# Patient Record
Sex: Female | Born: 1985 | Race: Black or African American | Hispanic: No | Marital: Single | Smoking: Current every day smoker
Health system: Southern US, Community
[De-identification: ages and names within clinical notes are randomized; demographics above are authoritative.]

## PROBLEM LIST (undated history)

## (undated) DIAGNOSIS — J40 Bronchitis, not specified as acute or chronic: Secondary | ICD-10-CM

## (undated) DIAGNOSIS — L732 Hidradenitis suppurativa: Secondary | ICD-10-CM

## (undated) DIAGNOSIS — Z8719 Personal history of other diseases of the digestive system: Secondary | ICD-10-CM

## (undated) HISTORY — PX: SKIN BIOPSY: SHX1

## (undated) HISTORY — PX: CYSTECTOMY: SUR359

---

## 2011-07-18 ENCOUNTER — Emergency Department (INDEPENDENT_AMBULATORY_CARE_PROVIDER_SITE_OTHER): Payer: Self-pay

## 2011-07-18 ENCOUNTER — Emergency Department (HOSPITAL_BASED_OUTPATIENT_CLINIC_OR_DEPARTMENT_OTHER)
Admission: EM | Admit: 2011-07-18 | Discharge: 2011-07-18 | Disposition: A | Discharge status: Home / Self Care | Payer: Self-pay | Attending: Emergency Medicine | Admitting: Emergency Medicine

## 2011-07-18 ENCOUNTER — Encounter (HOSPITAL_BASED_OUTPATIENT_CLINIC_OR_DEPARTMENT_OTHER): Payer: Self-pay | Admitting: Family Medicine

## 2011-07-18 DIAGNOSIS — R0602 Shortness of breath: Secondary | ICD-10-CM

## 2011-07-18 DIAGNOSIS — R51 Headache: Secondary | ICD-10-CM | POA: Insufficient documentation

## 2011-07-18 DIAGNOSIS — J069 Acute upper respiratory infection, unspecified: Secondary | ICD-10-CM | POA: Insufficient documentation

## 2011-07-18 DIAGNOSIS — R509 Fever, unspecified: Secondary | ICD-10-CM | POA: Insufficient documentation

## 2011-07-18 DIAGNOSIS — R0989 Other specified symptoms and signs involving the circulatory and respiratory systems: Secondary | ICD-10-CM

## 2011-07-18 DIAGNOSIS — R05 Cough: Secondary | ICD-10-CM

## 2011-07-18 MED ORDER — OXYMETAZOLINE HCL 0.05 % NA SOLN
2.0000 | Freq: Once | NASAL | Status: AC
  Administered 2011-07-18: 2 via NASAL
  Filled 2011-07-18: qty 15

## 2011-07-18 NOTE — ED Notes (Signed)
Pt c/o cough of clear sputum, sneezing and headache x 1 wk but worse since yesterday. Pt denies fever, n/v.

## 2011-07-18 NOTE — ED Notes (Signed)
Pt in xray

## 2011-07-18 NOTE — ED Provider Notes (Signed)
History  This chart was scribed for Cyndra Numbers, MD by Bennett Scrape. This patient was seen in room MH03/MH03 and the patient's care was started at 3:38PM.  CSN: [id number]  Arrival date & time 07/18/11  1401   First MD Initiated Contact with Patient 07/18/11 1537      Chief Complaint  Patient presents with  . Cough    Patient is a 26 y.o. female presenting with cough. The history is provided by the patient. No language interpreter was used.  Cough This is a new problem. The current episode started more than 2 days ago. The problem occurs every few minutes. The problem has been gradually worsening. The cough is productive of sputum. The maximum temperature recorded prior to her arrival was 100 to 100.9 F. Associated symptoms include headaches. Pertinent negatives include no chest pain, no chills, no sweats, no ear congestion, no ear pain, no rhinorrhea, no sore throat, no myalgias, no shortness of breath, no wheezing and no eye redness. Her past medical history does not include asthma.    Bridget Griffin is a 26 y.o. female who presents to the Emergency Department complaining of one week of gradual onset, gradually worsening productive cough of clear sputum with associated nasal congestion, chest pain with coughing, sneezing, mild fever and HA. Fever was measured at 99 in the ED. Pt states that the sympotms have been worse since yesterday. She denies any modifying factors. She has been tasking tylenol at home with mild improvement in the symptoms. She denies having any previous episodes of similar symptoms. She confirms sick contacts at home with cold like symptoms and pneumonia. She has not received a flu shot this year. She denies any urinary problems, nausea, vomiting and diarrhea as associated sympotms. She has no h/o chronic medical conditions and is not on any regular medications at home. She is a current smoker and alcohol user.  History reviewed. No pertinent past medical  history.  Past Surgical History  Procedure Date  . Skin biopsy     History reviewed. No pertinent family history.  History  Substance Use Topics  . Smoking status: Current Everyday Smoker  . Smokeless tobacco: Not on file  . Alcohol Use: Yes    Review of Systems  Constitutional: Positive for fever. Negative for chills.  HENT: Negative for ear pain, sore throat and rhinorrhea.   Eyes: Negative for redness.  Respiratory: Positive for cough. Negative for shortness of breath and wheezing.   Cardiovascular: Negative for chest pain.  Gastrointestinal: Negative for nausea, vomiting, abdominal pain and diarrhea.  Genitourinary: Negative for dysuria, hematuria and vaginal discharge.  Musculoskeletal: Negative for myalgias.  Skin: Negative for rash.  Neurological: Positive for headaches. Negative for weakness.    Allergies  Review of patient's allergies indicates no known allergies.  Home Medications  No current outpatient prescriptions on file.  Triage Vitals: BP 118/74  Pulse 84  Temp(Src) 99 F (37.2 C) (Oral)  Resp 16  Ht 5\' 4"  (1.626 m)  Wt 214 lb 2 oz (97.126 kg)  BMI 36.75 kg/m2  SpO2 100%  LMP 07/17/2011  Physical Exam  Nursing note and vitals reviewed. Constitutional: She is oriented to person, place, and time. She appears well-developed and well-nourished.  HENT:  Head: Normocephalic and atraumatic.       Nasal congestion  Eyes: Conjunctivae and EOM are normal.  Neck: Normal range of motion. Neck supple.  Cardiovascular: Normal rate, regular rhythm and normal heart sounds.   Pulmonary/Chest: Effort normal. No respiratory  distress.       Diminished at the right base  Abdominal: Soft. There is no tenderness.  Musculoskeletal: Normal range of motion. She exhibits no edema.  Neurological: She is alert and oriented to person, place, and time. No cranial nerve deficit.  Skin: Skin is warm and dry. No rash noted.  Psychiatric: She has a normal mood and affect.  Her behavior is normal.    ED Course  Procedures (including critical care time)  DIAGNOSTIC STUDIES: Oxygen Saturation is 100% on room air, normal by my interpretation.    COORDINATION OF CARE: 3:40PM-Discussed chest x-ray and nasal spray with pt at bedside and pt agreed to plan. 4:19PM-Discussed negative chest x-ray with pt and pt acknowledged results. Pt is comfortable being discharged home.  Labs Reviewed - No data to display  Dg Chest 2 View  07/18/2011  *RADIOLOGY REPORT*  Clinical Data: Shortness of breath, cough, congestion.  CHEST - 2 VIEW  Comparison: None.  Findings: Heart and mediastinal contours are within normal limits. No focal opacities or effusions.  No acute bony abnormality.  IMPRESSION: No active cardiopulmonary disease.  Original Report Authenticated By: Cyndie Chime, M.D.     1. URI (upper respiratory infection)       MDM  Patient had clear presentation with URI likely viral.  Given lung exam CXR was performed. This was negative and patient was discharged with afrin.  I personally performed the services described in this documentation, which was scribed in my presence. The recorded information has been reviewed and considered.         Cyndra Numbers, MD 07/20/11 [phone]

## 2011-10-06 ENCOUNTER — Encounter (HOSPITAL_BASED_OUTPATIENT_CLINIC_OR_DEPARTMENT_OTHER): Payer: Self-pay

## 2011-10-06 ENCOUNTER — Emergency Department (HOSPITAL_BASED_OUTPATIENT_CLINIC_OR_DEPARTMENT_OTHER)
Admission: EM | Admit: 2011-10-06 | Discharge: 2011-10-06 | Disposition: A | Discharge status: Home / Self Care | Payer: Self-pay | Attending: Emergency Medicine | Admitting: Emergency Medicine

## 2011-10-06 DIAGNOSIS — R1110 Vomiting, unspecified: Secondary | ICD-10-CM | POA: Insufficient documentation

## 2011-10-06 DIAGNOSIS — R197 Diarrhea, unspecified: Secondary | ICD-10-CM | POA: Insufficient documentation

## 2011-10-06 LAB — URINALYSIS, ROUTINE W REFLEX MICROSCOPIC
Glucose, UA: NEGATIVE mg/dL
Hgb urine dipstick: NEGATIVE
Specific Gravity, Urine: 1.026 (ref 1.005–1.030)
pH: 6 (ref 5.0–8.0)

## 2011-10-06 LAB — PREGNANCY, URINE: Preg Test, Ur: NEGATIVE

## 2011-10-06 MED ORDER — ONDANSETRON 4 MG PO TBDP
4.0000 mg | ORAL_TABLET | Freq: Once | ORAL | Status: AC
  Administered 2011-10-06: 4 mg via ORAL
  Filled 2011-10-06: qty 1

## 2011-10-06 MED ORDER — ONDANSETRON 4 MG PO TBDP: 4.0000 mg | ORAL_TABLET | Freq: Three times a day (TID) | ORAL | Status: AC | PRN

## 2011-10-06 NOTE — ED Provider Notes (Signed)
History     CSN: [id number]  Arrival date & time 10/06/11  1102   First MD Initiated Contact with Patient 10/06/11 1158      Chief Complaint  Patient presents with  . Emesis  . Diarrhea    (Consider location/radiation/quality/duration/timing/severity/associated sxs/prior treatment) Patient is a 26 y.o. female presenting with vomiting and diarrhea. The history is provided by the patient. No language interpreter was used.  Emesis  This is a new problem. The current episode started yesterday. The problem occurs 5 to 10 times per day. The problem has not changed since onset.There has been no fever. Associated symptoms include diarrhea. Pertinent negatives include no fever.  Diarrhea The primary symptoms include vomiting and diarrhea. Primary symptoms do not include fever.    History reviewed. No pertinent past medical history.  Past Surgical History  Procedure Date  . Skin biopsy   . Cystectomy     No family history on file.  History  Substance Use Topics  . Smoking status: Current Everyday Smoker  . Smokeless tobacco: Not on file  . Alcohol Use: No    OB History    Grav Para Term Preterm Abortions TAB SAB Ect Mult Living                  Review of Systems  Constitutional: Negative for fever.  Eyes: Negative.   Respiratory: Negative.   Cardiovascular: Negative.   Gastrointestinal: Positive for vomiting and diarrhea.    Allergies  Review of patient's allergies indicates no known allergies.  Home Medications  No current outpatient prescriptions on file.  BP 125/91  Pulse 82  Temp(Src) 97.8 F (36.6 C) (Oral)  Resp 16  Ht 5\' 4"  (1.626 m)  Wt 210 lb (95.255 kg)  BMI 36.05 kg/m2  SpO2 100%  LMP 09/28/2011  Physical Exam  Nursing note and vitals reviewed. Constitutional: She is oriented to person, place, and time. She appears well-developed and well-nourished.  HENT:  Head: Normocephalic and atraumatic.  Eyes: Conjunctivae and EOM are normal.  Neck:  Neck supple.  Cardiovascular: Normal rate and regular rhythm.   Pulmonary/Chest: Effort normal and breath sounds normal.  Abdominal: Soft. Bowel sounds are normal. There is no tenderness.  Musculoskeletal: Normal range of motion.  Neurological: She is alert and oriented to person, place, and time.  Skin: Skin is warm and dry.  Psychiatric: She has a normal mood and affect.    ED Course  Procedures (including critical care time)   Labs Reviewed  URINALYSIS, ROUTINE W REFLEX MICROSCOPIC  PREGNANCY, URINE   No results found.   1. Vomiting and diarrhea       MDM  Pt showing not sign of dehydration:pt tolerating po here:symptoms likely viral:will send home with some zofran        Teressa Lower, NP 10/06/11 1409

## 2011-10-06 NOTE — Discharge Instructions (Signed)
B.R.A.T. Diet Your doctor has recommended the B.R.A.T. diet for you or your child until the condition improves. This is often used to help control diarrhea and vomiting symptoms. If you or your child can tolerate clear liquids, you may have:  Bananas.   Rice.   Applesauce.   Toast (and other simple starches such as crackers, potatoes, noodles).  Be sure to avoid dairy products, meats, and fatty foods until symptoms are better. Fruit juices such as apple, grape, and prune juice can make diarrhea worse. Avoid these. Continue this diet for 2 days or as instructed by your caregiver. Document Released: 06/20/2005 Document Revised: 06/09/2011 Document Reviewed: 12/07/2006 Exit[employer] Patient Information 2012 [employer]. 

## 2011-10-06 NOTE — ED Notes (Signed)
C/o n/v/d started yesterday-NAD-steady gait to tx area

## 2011-10-06 NOTE — ED Notes (Signed)
Pt given ginger ale per request

## 2011-10-06 NOTE — ED Provider Notes (Signed)
Medical screening examination/treatment/procedure(s) were performed by non-physician practitioner and as supervising physician I was immediately available for consultation/collaboration.   Dione Booze, MD 10/06/11 1626

## 2012-01-20 ENCOUNTER — Encounter (HOSPITAL_BASED_OUTPATIENT_CLINIC_OR_DEPARTMENT_OTHER): Payer: Self-pay | Admitting: *Deleted

## 2012-01-20 ENCOUNTER — Emergency Department (HOSPITAL_BASED_OUTPATIENT_CLINIC_OR_DEPARTMENT_OTHER)
Admission: EM | Admit: 2012-01-20 | Discharge: 2012-01-20 | Disposition: A | Discharge status: Home / Self Care | Payer: Self-pay | Attending: Emergency Medicine | Admitting: Emergency Medicine

## 2012-01-20 DIAGNOSIS — F17200 Nicotine dependence, unspecified, uncomplicated: Secondary | ICD-10-CM | POA: Insufficient documentation

## 2012-01-20 DIAGNOSIS — R1110 Vomiting, unspecified: Secondary | ICD-10-CM | POA: Insufficient documentation

## 2012-01-20 DIAGNOSIS — R197 Diarrhea, unspecified: Secondary | ICD-10-CM | POA: Insufficient documentation

## 2012-01-20 DIAGNOSIS — N390 Urinary tract infection, site not specified: Secondary | ICD-10-CM | POA: Insufficient documentation

## 2012-01-20 LAB — URINALYSIS, ROUTINE W REFLEX MICROSCOPIC
Nitrite: NEGATIVE
Specific Gravity, Urine: 1.02 (ref 1.005–1.030)
pH: 6 (ref 5.0–8.0)

## 2012-01-20 LAB — URINE MICROSCOPIC-ADD ON

## 2012-01-20 MED ORDER — ONDANSETRON 4 MG PO TBDP
4.0000 mg | ORAL_TABLET | Freq: Once | ORAL | Status: AC
  Administered 2012-01-20: 4 mg via ORAL
  Filled 2012-01-20: qty 1

## 2012-01-20 MED ORDER — ONDANSETRON 4 MG PO TBDP: 4.0000 mg | ORAL_TABLET | Freq: Three times a day (TID) | ORAL | Status: AC | PRN

## 2012-01-20 MED ORDER — SULFAMETHOXAZOLE-TRIMETHOPRIM 800-160 MG PO TABS: 1.0000 | ORAL_TABLET | Freq: Two times a day (BID) | ORAL | Status: AC

## 2012-01-20 NOTE — ED Notes (Signed)
Diarrhea and vomiting since yesterday.

## 2012-01-20 NOTE — ED Provider Notes (Signed)
History/physical exam/procedure(s) were performed by non-physician practitioner and as supervising physician I was immediately available for consultation/collaboration. I have reviewed all notes and am in agreement with care and plan.   Sianne Tejada S Lenyx Boody, MD 01/20/12 2314 

## 2012-01-20 NOTE — ED Notes (Signed)
pt reports N/V and diahrrea since yesterday, states she has also been having lots of indigestion. Also states that she has been around someone with the, "stomach bug."

## 2012-01-20 NOTE — ED Provider Notes (Signed)
History     CSN: [id number]  Arrival date & time 01/20/12  6578   First MD Initiated Contact with Patient 01/20/12 1953      Chief Complaint  Patient presents with  . GI Problem    (Consider location/radiation/quality/duration/timing/severity/associated sxs/prior treatment) HPI Comments: Pt states that 2 days ago he had vomiting which has resolved today but she has continued to have diarrhea:pt denies fever:pt states that her family member had a stomach virus and she thinks she has the same  Patient is a 26 y.o. female presenting with GI illness. The history is provided by the patient. No language interpreter was used.  GI Problem  This is a new problem. The current episode started 12 to 24 hours ago. There has been no fever. She has tried nothing for the symptoms.    History reviewed. No pertinent past medical history.  Past Surgical History  Procedure Date  . Skin biopsy   . Cystectomy     No family history on file.  History  Substance Use Topics  . Smoking status: Current Everyday Smoker  . Smokeless tobacco: Not on file  . Alcohol Use: No    OB History    Grav Para Term Preterm Abortions TAB SAB Ect Mult Living                  Review of Systems  Constitutional: Negative.   Respiratory: Negative.   Cardiovascular: Negative.   Neurological: Negative.     Allergies  Review of patient's allergies indicates no known allergies.  Home Medications  No current outpatient prescriptions on file.  BP 126/76  Pulse 77  Temp 98.3 F (36.8 C) (Oral)  Resp 20  SpO2 100%  Physical Exam  Nursing note and vitals reviewed. Constitutional: She is oriented to person, place, and time. She appears well-developed and well-nourished.  Cardiovascular: Normal rate and regular rhythm.   Pulmonary/Chest: Effort normal and breath sounds normal.  Abdominal: Soft. Bowel sounds are normal. There is no tenderness.  Neurological: She is alert and oriented to person, place, and  time.  Skin: Skin is warm and dry.    ED Course  Procedures (including critical care time)  Labs Reviewed  URINALYSIS, ROUTINE W REFLEX MICROSCOPIC - Abnormal; Notable for the following:    APPearance CLOUDY (*)     Leukocytes, UA MODERATE (*)     All other components within normal limits  URINE MICROSCOPIC-ADD ON - Abnormal; Notable for the following:    Squamous Epithelial / LPF FEW (*)     Bacteria, UA FEW (*)     All other components within normal limits  PREGNANCY, URINE   No results found.   1. UTI (lower urinary tract infection)   2. Vomiting and diarrhea       MDM  Pts abdomen is benign:pt tolerating po here:symptoms likely viral:pt given something for nausea:don't think pt needs fluids at this time        Teressa Lower, NP 01/20/12 2112

## 2012-09-25 ENCOUNTER — Encounter (HOSPITAL_BASED_OUTPATIENT_CLINIC_OR_DEPARTMENT_OTHER): Payer: Self-pay | Admitting: *Deleted

## 2012-09-25 ENCOUNTER — Emergency Department (HOSPITAL_BASED_OUTPATIENT_CLINIC_OR_DEPARTMENT_OTHER)
Admission: EM | Admit: 2012-09-25 | Discharge: 2012-09-25 | Disposition: A | Discharge status: Home / Self Care | Payer: Self-pay | Attending: Emergency Medicine | Admitting: Emergency Medicine

## 2012-09-25 ENCOUNTER — Emergency Department (HOSPITAL_BASED_OUTPATIENT_CLINIC_OR_DEPARTMENT_OTHER): Payer: Self-pay

## 2012-09-25 DIAGNOSIS — J209 Acute bronchitis, unspecified: Secondary | ICD-10-CM | POA: Insufficient documentation

## 2012-09-25 DIAGNOSIS — J40 Bronchitis, not specified as acute or chronic: Secondary | ICD-10-CM

## 2012-09-25 DIAGNOSIS — F17200 Nicotine dependence, unspecified, uncomplicated: Secondary | ICD-10-CM | POA: Insufficient documentation

## 2012-09-25 DIAGNOSIS — J3489 Other specified disorders of nose and nasal sinuses: Secondary | ICD-10-CM | POA: Insufficient documentation

## 2012-09-25 MED ORDER — ALBUTEROL SULFATE HFA 108 (90 BASE) MCG/ACT IN AERS: 2.0000 | INHALATION_SPRAY | Freq: Once | RESPIRATORY_TRACT | Status: DC

## 2012-09-25 MED ORDER — ALBUTEROL SULFATE (5 MG/ML) 0.5% IN NEBU
2.5000 mg | INHALATION_SOLUTION | RESPIRATORY_TRACT | Status: DC
  Administered 2012-09-25: 2.5 mg via RESPIRATORY_TRACT

## 2012-09-25 MED ORDER — ALBUTEROL SULFATE HFA 108 (90 BASE) MCG/ACT IN AERS
INHALATION_SPRAY | RESPIRATORY_TRACT | Status: AC
  Filled 2012-09-25: qty 6.7

## 2012-09-25 MED ORDER — BENZONATATE 100 MG PO CAPS: 100.0000 mg | ORAL_CAPSULE | Freq: Three times a day (TID) | ORAL | Status: DC

## 2012-09-25 MED ORDER — ALBUTEROL SULFATE (5 MG/ML) 0.5% IN NEBU
INHALATION_SOLUTION | RESPIRATORY_TRACT | Status: AC
  Administered 2012-09-25: 2.5 mg via RESPIRATORY_TRACT
  Filled 2012-09-25: qty 0.5

## 2012-09-25 MED ORDER — AZITHROMYCIN 250 MG PO TABS: 250.0000 mg | ORAL_TABLET | Freq: Every day | ORAL | Status: DC

## 2012-09-25 MED ORDER — ALBUTEROL SULFATE HFA 108 (90 BASE) MCG/ACT IN AERS: 2.0000 | INHALATION_SPRAY | RESPIRATORY_TRACT | Status: DC | PRN

## 2012-09-25 NOTE — ED Notes (Signed)
RT Note: Patient was instructed on proper MDI use with spacer. She is familiar with using a spacer and MDI and demonstrated technique well. Patient currently has no questions and RT will continue to monitor.

## 2012-09-25 NOTE — ED Notes (Signed)
Pt amb to triage with quick steady gait in nad. Pt reports cough and congestion x 2 weeks, seen at [facility] and rx amoxicillin for bronchitis, pt states she is no better. Completed amoxicillin course.

## 2012-09-25 NOTE — ED Notes (Signed)
Patient transported to X-ray 

## 2012-09-25 NOTE — ED Provider Notes (Signed)
History     CSN: [id number]  Arrival date & time 09/25/12  1522   First MD Initiated Contact with Patient 09/25/12 1533      Chief Complaint  Patient presents with  . Cough  . Nasal Congestion    (Consider location/radiation/quality/duration/timing/severity/associated sxs/prior treatment) Patient is a 27 y.o. female presenting with cough. The history is provided by the patient. No language interpreter was used.  Cough Cough characteristics:  Productive and harsh Sputum characteristics:  Clear Severity:  Severe Onset quality:  Gradual Timing:  Constant Progression:  Worsening Chronicity:  New Smoker: yes   Relieved by:  Nothing Worsened by:  Nothing tried Associated symptoms: no shortness of breath     History reviewed. No pertinent past medical history.  Past Surgical History  Procedure Laterality Date  . Skin biopsy    . Cystectomy      History reviewed. No pertinent family history.  History  Substance Use Topics  . Smoking status: Current Every Day Smoker  . Smokeless tobacco: Not on file  . Alcohol Use: No    OB History   Grav Para Term Preterm Abortions TAB SAB Ect Mult Living                  Review of Systems  Respiratory: Positive for cough. Negative for shortness of breath.   All other systems reviewed and are negative.    Allergies  Review of patient's allergies indicates no known allergies.  Home Medications   Current Outpatient Rx  Name  Route  Sig  Dispense  Refill  . ibuprofen (ADVIL,MOTRIN) 200 MG tablet   Oral   Take 400 mg by mouth every 6 (six) hours as needed. For cramps.           BP 109/97  Pulse 85  Temp(Src) 98.8 F (37.1 C) (Oral)  SpO2 100%  LMP 08/04/2012  Physical Exam  Nursing note and vitals reviewed. Constitutional: She is oriented to person, place, and time. She appears well-developed and well-nourished.  HENT:  Head: Normocephalic and atraumatic.  Right Ear: External ear normal.  Eyes: Conjunctivae  and EOM are normal. Pupils are equal, round, and reactive to light.  Neck: Normal range of motion. Neck supple.  Cardiovascular: Normal rate.   Pulmonary/Chest: Effort normal and breath sounds normal.  Abdominal: Soft.  Musculoskeletal: Normal range of motion.  Neurological: She is alert and oriented to person, place, and time.  Skin: Skin is warm.  Psychiatric: She has a normal mood and affect.    ED Course  Procedures (including critical care time)  Labs Reviewed - No data to display Dg Chest 2 View  09/25/2012  *RADIOLOGY REPORT*  Clinical Data: Cough  CHEST - 2 VIEW  Comparison: July 18, 2011  Findings: There is no focal infiltrate, pulmonary edema, or pleural effusion.  Mediastinal contour and cardiac silhouette are normal. The soft tissue and osseous structures are normal.  IMPRESSION: No acute cardiopulmonary disease identified.   Original Report Authenticated By: Sherian Rein, M.D.      No diagnosis found.    MDM   Pt given albuterol neb.  Chest xray no pneumonia.  Rx for albuterol and zithromax      Elson Areas, PA-C 09/25/12 [address] [place], [place] 09/25/12 [phone]

## 2012-09-28 NOTE — ED Provider Notes (Signed)
Medical screening examination/treatment/procedure(s) were performed by non-physician practitioner and as supervising physician I was immediately available for consultation/collaboration.  Arturo Sofranko T Ellard Nan, MD 09/28/12 0733 

## 2013-09-26 ENCOUNTER — Encounter (HOSPITAL_BASED_OUTPATIENT_CLINIC_OR_DEPARTMENT_OTHER): Payer: Self-pay | Admitting: Emergency Medicine

## 2013-09-26 ENCOUNTER — Emergency Department (HOSPITAL_BASED_OUTPATIENT_CLINIC_OR_DEPARTMENT_OTHER)
Admission: EM | Admit: 2013-09-26 | Discharge: 2013-09-26 | Disposition: A | Discharge status: Home / Self Care | Payer: Self-pay | Attending: Emergency Medicine | Admitting: Emergency Medicine

## 2013-09-26 DIAGNOSIS — F17200 Nicotine dependence, unspecified, uncomplicated: Secondary | ICD-10-CM | POA: Insufficient documentation

## 2013-09-26 DIAGNOSIS — J069 Acute upper respiratory infection, unspecified: Secondary | ICD-10-CM | POA: Insufficient documentation

## 2013-09-26 DIAGNOSIS — Z79899 Other long term (current) drug therapy: Secondary | ICD-10-CM | POA: Insufficient documentation

## 2013-09-26 DIAGNOSIS — Z792 Long term (current) use of antibiotics: Secondary | ICD-10-CM | POA: Insufficient documentation

## 2013-09-26 MED ORDER — OXYMETAZOLINE HCL 0.05 % NA SOLN
1.0000 | Freq: Once | NASAL | Status: AC
  Administered 2013-09-26: 1 via NASAL
  Filled 2013-09-26: qty 15

## 2013-09-26 NOTE — ED Notes (Signed)
Headache for a week. Sneezing, lost of voice, and eyes are irritated. She has been taking OTC allergy medication with no relief.

## 2013-09-26 NOTE — Discharge Instructions (Signed)

## 2013-09-26 NOTE — ED Provider Notes (Signed)
CSN: [id number]     Arrival date & time 09/26/13  1057 History   First MD Initiated Contact with Patient 09/26/13 1134     Chief Complaint  Patient presents with  . URI     (Consider location/radiation/quality/duration/timing/severity/associated sxs/prior Treatment) Patient is a 28 y.o. female presenting with URI. The history is provided by the patient.  URI Presenting symptoms: congestion and rhinorrhea   Presenting symptoms: no cough and no fever   Severity:  Mild Onset quality:  Gradual Duration:  1 week Timing:  Constant Progression:  Worsening Chronicity:  New Relieved by:  Nothing Worsened by:  Nothing tried Associated symptoms: sneezing   Associated symptoms: no neck pain, no sinus pain and no wheezing     History reviewed. No pertinent past medical history. Past Surgical History  Procedure Laterality Date  . Skin biopsy    . Cystectomy     No family history on file. History  Substance Use Topics  . Smoking status: Current Every Day Smoker  . Smokeless tobacco: Not on file  . Alcohol Use: No   OB History   Grav Para Term Preterm Abortions TAB SAB Ect Mult Living                 Review of Systems  Constitutional: Negative for fever.  HENT: Positive for congestion, postnasal drip, rhinorrhea, sinus pressure and sneezing. Negative for tinnitus and trouble swallowing.   Respiratory: Negative for cough, shortness of breath and wheezing.   Musculoskeletal: Negative for neck pain.  All other systems reviewed and are negative.      Allergies  Review of patient's allergies indicates no known allergies.  Home Medications   Current Outpatient Rx  Name  Route  Sig  Dispense  Refill  . azithromycin (ZITHROMAX) 250 MG tablet   Oral   Take 1 tablet (250 mg total) by mouth daily. Take first 2 tablets together, then 1 every day until finished.   6 tablet   0   . benzonatate (TESSALON) 100 MG capsule   Oral   Take 1 capsule (100 mg total) by mouth every 8  (eight) hours.   21 capsule   0   . ibuprofen (ADVIL,MOTRIN) 200 MG tablet   Oral   Take 400 mg by mouth every 6 (six) hours as needed. For cramps.          BP 120/83  Pulse 58  Temp(Src) 97.8 F (36.6 C) (Oral)  Resp 20  Ht 5\' 4"  (1.626 m)  Wt 215 lb (97.523 kg)  BMI 36.89 kg/m2  SpO2 100% Physical Exam  Nursing note and vitals reviewed. Constitutional: She is oriented to person, place, and time. She appears well-developed and well-nourished. No distress.  HENT:  Head: Normocephalic and atraumatic.  Right Ear: Tympanic membrane normal.  Left Ear: Tympanic membrane normal.  Nose: Mucosal edema (and redness) present.  Mouth/Throat: Oropharynx is clear and moist.  Eyes: EOM are normal. Pupils are equal, round, and reactive to light.  Neck: Normal range of motion. Neck supple.  Cardiovascular: Normal rate and regular rhythm.  Exam reveals no friction rub.   No murmur heard. Pulmonary/Chest: Effort normal and breath sounds normal. No respiratory distress. She has no wheezes. She has no rales.  Abdominal: Soft. She exhibits no distension. There is no tenderness. There is no rebound.  Musculoskeletal: Normal range of motion. She exhibits no edema.  Neurological: She is alert and oriented to person, place, and time.  Skin: No rash noted. She  is not diaphoretic.    ED Course  Procedures (including critical care time) Labs Review Labs Reviewed - No data to display Imaging Review No results found.   EKG Interpretation None      MDM   Final diagnoses:  Viral URI    81F here with URI symptoms for 1 week. Nasal congestion, sneezing. No relief with nasal sprays, allegra. Vitals stable, no fevers. Hyperemic nasal mucosa, oropharynx clear. TMs clear. No need for antiobiotics, given afrin and instructed to use decongestants. Stable for discharge.    Dagmar Hait, MD 09/26/13 1213

## 2013-10-07 ENCOUNTER — Emergency Department (HOSPITAL_BASED_OUTPATIENT_CLINIC_OR_DEPARTMENT_OTHER)
Admission: EM | Admit: 2013-10-07 | Discharge: 2013-10-07 | Disposition: A | Discharge status: Home / Self Care | Payer: Self-pay | Attending: Emergency Medicine | Admitting: Emergency Medicine

## 2013-10-07 ENCOUNTER — Encounter (HOSPITAL_BASED_OUTPATIENT_CLINIC_OR_DEPARTMENT_OTHER): Payer: Self-pay | Admitting: Emergency Medicine

## 2013-10-07 DIAGNOSIS — F17200 Nicotine dependence, unspecified, uncomplicated: Secondary | ICD-10-CM | POA: Insufficient documentation

## 2013-10-07 DIAGNOSIS — B349 Viral infection, unspecified: Secondary | ICD-10-CM

## 2013-10-07 DIAGNOSIS — J029 Acute pharyngitis, unspecified: Secondary | ICD-10-CM | POA: Insufficient documentation

## 2013-10-07 DIAGNOSIS — B9789 Other viral agents as the cause of diseases classified elsewhere: Secondary | ICD-10-CM | POA: Insufficient documentation

## 2013-10-07 DIAGNOSIS — H9209 Otalgia, unspecified ear: Secondary | ICD-10-CM | POA: Insufficient documentation

## 2013-10-07 LAB — RAPID STREP SCREEN (MED CTR MEBANE ONLY): STREPTOCOCCUS, GROUP A SCREEN (DIRECT): NEGATIVE

## 2013-10-07 MED ORDER — IBUPROFEN 600 MG PO TABS: 600.0000 mg | ORAL_TABLET | Freq: Four times a day (QID) | ORAL | Status: DC | PRN

## 2013-10-07 NOTE — Discharge Instructions (Signed)
Return to the ED with any concerns including difficulty breathing, vomiting and not able to keep down liquids, decreased urine output, decreased level of alertness/lethargy, or any other alarming symptoms  °

## 2013-10-07 NOTE — ED Notes (Signed)
Pt reports seen last week now she is having left ear pain and sore throat onset 3 days ago. The URI she was treated for last week is getting better

## 2013-10-07 NOTE — ED Notes (Signed)
Pt given blanket and call light.

## 2013-10-07 NOTE — ED Provider Notes (Signed)
CSN: [id number]     Arrival date [id number]& time 10/07/13  1036 History   First MD Initiated Contact with Patient 10/07/13 1103     Chief Complaint  Patient presents with  . sore throat ear pain      (Consider location/radiation/quality/duration/timing/severity/associated sxs/prior Treatment) HPI Pt presents with c/o left ear pain and sore throat.  She was seen several days ago for URI symptoms.  She states the nasal congestion has improved but sore throat and ear pain have now developed.  No fever/chills.  She has been taking ibuprofen with some relief.  No difficulty swallowing or breathing.  Has continued to drink liquids normally.  No changes in voice.  There are no other associated systemic symptoms, there are no other alleviating or modifying factors.   History reviewed. No pertinent past medical history. Past Surgical History  Procedure Laterality Date  . Skin biopsy    . Cystectomy     History reviewed. No pertinent family history. History  Substance Use Topics  . Smoking status: Current Every Day Smoker  . Smokeless tobacco: Not on file  . Alcohol Use: No   OB History   Grav Para Term Preterm Abortions TAB SAB Ect Mult Living                 Review of Systems ROS reviewed and all otherwise negative except for mentioned in HPI    Allergies  Review of patient's allergies indicates no known allergies.  Home Medications   Current Outpatient Rx  Name  Route  Sig  Dispense  Refill  . azithromycin (ZITHROMAX) 250 MG tablet   Oral   Take 1 tablet (250 mg total) by mouth daily. Take first 2 tablets together, then 1 every day until finished.   6 tablet   0   . benzonatate (TESSALON) 100 MG capsule   Oral   Take 1 capsule (100 mg total) by mouth every 8 (eight) hours.   21 capsule   0   . ibuprofen (ADVIL,MOTRIN) 200 MG tablet   Oral   Take 400 mg by mouth every 6 (six) hours as needed. For cramps.         Marland Kitchen. ibuprofen (ADVIL,MOTRIN) 600 MG tablet   Oral   Take 1  tablet (600 mg total) by mouth every 6 (six) hours as needed.   30 tablet   0    BP 119/76  Pulse 78  Temp(Src) 98.9 F (37.2 C) (Oral)  Resp 16  Ht 5\' 4"  (1.626 m)  Wt 213 lb (96.616 kg)  BMI 36.54 kg/m2  SpO2 98% Vitals reviewed Physical Exam Physical Examination: General appearance - alert, well appearing, and in no distress Mental status - alert, oriented to person, place, and time Eyes - pupils equal and reactive, no conjunctival injection, no scleral icterus Mouth - mucous membranes moist, pharynx normal without lesions, mild erythema of OP, no exudate, palate symmetric, uvula midline Ears bilateral TMs and EACs normal Neck - supple, no significant adenopathy Chest - clear to auscultation, no wheezes, rales or rhonchi, symmetric air entry Heart - normal rate, regular rhythm, normal S1, S2, no murmurs, rubs, clicks or gallops Abdomen - soft, nontender, nondistended, no masses or organomegaly Extremities - peripheral pulses normal, no pedal edema, no clubbing or cyanosis Skin - normal coloration and turgor, no rashes  ED Course  Procedures (including critical care time) Labs Review Labs Reviewed  RAPID STREP SCREEN   Imaging Review No results found.   EKG Interpretation None  MDM   Final diagnoses:  Viral infection  Pharyngitis  Referred ear pain    Pt presenting with c/o left ear pain and sore throat.  Rapid strep negative, no signs of PTA, no OM or other abnoralities on ear exam.  Suspect viral infection causing pain.  Discharged with strict return precautions.  Pt agreeable with plan.    Ethelda Chick, MD 10/12/13 0001

## 2013-12-19 ENCOUNTER — Emergency Department (HOSPITAL_BASED_OUTPATIENT_CLINIC_OR_DEPARTMENT_OTHER)
Admission: EM | Admit: 2013-12-19 | Discharge: 2013-12-19 | Disposition: A | Discharge status: Home / Self Care | Payer: Self-pay | Attending: Emergency Medicine | Admitting: Emergency Medicine

## 2013-12-19 ENCOUNTER — Encounter (HOSPITAL_BASED_OUTPATIENT_CLINIC_OR_DEPARTMENT_OTHER): Payer: Self-pay | Admitting: Emergency Medicine

## 2013-12-19 DIAGNOSIS — Z79899 Other long term (current) drug therapy: Secondary | ICD-10-CM | POA: Insufficient documentation

## 2013-12-19 DIAGNOSIS — H66009 Acute suppurative otitis media without spontaneous rupture of ear drum, unspecified ear: Secondary | ICD-10-CM | POA: Insufficient documentation

## 2013-12-19 DIAGNOSIS — J020 Streptococcal pharyngitis: Secondary | ICD-10-CM | POA: Insufficient documentation

## 2013-12-19 DIAGNOSIS — L02415 Cutaneous abscess of right lower limb: Secondary | ICD-10-CM

## 2013-12-19 DIAGNOSIS — H66001 Acute suppurative otitis media without spontaneous rupture of ear drum, right ear: Secondary | ICD-10-CM

## 2013-12-19 DIAGNOSIS — Z791 Long term (current) use of non-steroidal anti-inflammatories (NSAID): Secondary | ICD-10-CM | POA: Insufficient documentation

## 2013-12-19 DIAGNOSIS — Z792 Long term (current) use of antibiotics: Secondary | ICD-10-CM | POA: Insufficient documentation

## 2013-12-19 DIAGNOSIS — L02419 Cutaneous abscess of limb, unspecified: Secondary | ICD-10-CM | POA: Insufficient documentation

## 2013-12-19 DIAGNOSIS — F17200 Nicotine dependence, unspecified, uncomplicated: Secondary | ICD-10-CM | POA: Insufficient documentation

## 2013-12-19 DIAGNOSIS — L03119 Cellulitis of unspecified part of limb: Secondary | ICD-10-CM

## 2013-12-19 LAB — RAPID STREP SCREEN (MED CTR MEBANE ONLY): STREPTOCOCCUS, GROUP A SCREEN (DIRECT): POSITIVE — AB

## 2013-12-19 MED ORDER — AMOXICILLIN 500 MG PO CAPS: 1000.0000 mg | ORAL_CAPSULE | Freq: Two times a day (BID) | ORAL | Status: DC

## 2013-12-19 MED ORDER — ACETAMINOPHEN-CODEINE #3 300-30 MG PO TABS: 1.0000 | ORAL_TABLET | Freq: Four times a day (QID) | ORAL | Status: DC | PRN

## 2013-12-19 NOTE — ED Provider Notes (Signed)
CSN: [id number]     Arrival date & time 12/19/13  1007 History   First MD Initiated Contact with Patient 12/19/13 1037     Chief Complaint  Patient presents with  . Otalgia     (Consider location/radiation/quality/duration/timing/severity/associated sxs/prior Treatment) Patient is a 28 y.o. female presenting with ear pain. The history is provided by the patient. No language interpreter was used.  Otalgia Location:  Right Behind ear:  No abnormality Severity:  Moderate Onset quality:  Unable to specify Duration:  2 days Timing:  Constant Progression:  Worsening Chronicity:  New Relieved by:  Nothing Worsened by:  Nothing tried Ineffective treatments:  OTC medications Associated symptoms: sore throat   Associated symptoms: no abdominal pain, no congestion, no cough, no diarrhea, no ear discharge, no fever, no headaches, no hearing loss, no neck pain, no rash, no rhinorrhea, no tinnitus and no vomiting   Sore throat:    Severity:  Moderate   Onset quality:  Unable to specify   Duration:  2 days   Timing:  Constant   Progression:  Worsening Risk factors: no recent travel, no chronic ear infection and no prior ear surgery     History reviewed. No pertinent past medical history. Past Surgical History  Procedure Laterality Date  . Skin biopsy    . Cystectomy     History reviewed. No pertinent family history. History  Substance Use Topics  . Smoking status: Current Every Day Smoker  . Smokeless tobacco: Not on file  . Alcohol Use: No   OB History   Grav Para Term Preterm Abortions TAB SAB Ect Mult Living                 Review of Systems  Constitutional: Negative for fever, chills, diaphoresis, activity change, appetite change and fatigue.  HENT: Positive for ear pain and sore throat. Negative for congestion, ear discharge, facial swelling, hearing loss, rhinorrhea and tinnitus.   Eyes: Negative for photophobia and discharge.  Respiratory: Negative for cough, chest  tightness and shortness of breath.   Cardiovascular: Negative for chest pain, palpitations and leg swelling.  Gastrointestinal: Negative for nausea, vomiting, abdominal pain and diarrhea.  Endocrine: Negative for polydipsia and polyuria.  Genitourinary: Negative for dysuria, frequency, difficulty urinating and pelvic pain.  Musculoskeletal: Negative for arthralgias, back pain, neck pain and neck stiffness.  Skin: Negative for color change, rash and wound.  Allergic/Immunologic: Negative for immunocompromised state.  Neurological: Negative for facial asymmetry, weakness, numbness and headaches.  Hematological: Does not bruise/bleed easily.  Psychiatric/Behavioral: Negative for confusion and agitation.      Allergies  Review of patient's allergies indicates no known allergies.  Home Medications   Prior to Admission medications   Medication Sig Start Date End Date Taking? Authorizing Provider  azithromycin (ZITHROMAX) 250 MG tablet Take 1 tablet (250 mg total) by mouth daily. Take first 2 tablets together, then 1 every day until finished. 09/25/12   Elson AreasLeslie K Sofia, PA-C  benzonatate (TESSALON) 100 MG capsule Take 1 capsule (100 mg total) by mouth every 8 (eight) hours. 09/25/12   Elson AreasLeslie K Sofia, PA-C  ibuprofen (ADVIL,MOTRIN) 200 MG tablet Take 400 mg by mouth every 6 (six) hours as needed. For cramps.    Historical Provider, MD  ibuprofen (ADVIL,MOTRIN) 600 MG tablet Take 1 tablet (600 mg total) by mouth every 6 (six) hours as needed. 10/07/13   Ethelda ChickMartha K Linker, MD   BP 122/86  Pulse 72  Temp(Src) 98 F (36.7 C) (Oral)  Resp 16  Ht 5\' 4"  (1.626 m)  Wt 218 lb 3.2 oz (98.975 kg)  BMI 37.44 kg/m2  SpO2 100%  LMP 11/18/2013 Physical Exam  Constitutional: She is oriented to person, place, and time. She appears well-developed and well-nourished. No distress.  HENT:  Head: Normocephalic and atraumatic.  Right Ear: External ear normal. Tympanic membrane is injected. A middle ear effusion  is present.  Left Ear: Tympanic membrane, external ear and ear canal normal.  Mouth/Throat: Mucous membranes are normal. Posterior oropharyngeal edema and posterior oropharyngeal erythema present. No oropharyngeal exudate or tonsillar abscesses.  Eyes: Pupils are equal, round, and reactive to light.  Neck: Normal range of motion. Neck supple.  Cardiovascular: Normal rate, regular rhythm and normal heart sounds.  Exam reveals no gallop and no friction rub.   No murmur heard. Pulmonary/Chest: Effort normal and breath sounds normal. No respiratory distress. She has no wheezes. She has no rales.  Abdominal: Soft. Bowel sounds are normal. She exhibits no distension and no mass. There is no tenderness. There is no rebound and no guarding.  Musculoskeletal: Normal range of motion. She exhibits no edema and no tenderness.  Neurological: She is alert and oriented to person, place, and time.  Skin: Skin is warm and dry.  Psychiatric: She has a normal mood and affect.    ED Course  Procedures (including critical care time) Labs Review Labs Reviewed  RAPID STREP SCREEN - Abnormal; Notable for the following:    Streptococcus, Group A Screen (Direct) POSITIVE (*)    All other components within normal limits    Imaging Review No results found.   EKG Interpretation None      MDM   Final diagnoses:  Acute suppurative otitis media of right ear without spontaneous rupture of tympanic membrane, recurrence not specified  Strep pharyngitis  Abscess of right thigh    Pt is a 28 y.o. female with Pmhx as above who presents with sore throat and R ear pain since yesterday.  R ear irrigated due to cerumen inpaction & revealed dull TM w/ erythema and effusion. L TM clear. She also has BL tonsillar enlargement w/ midline, nml uvula. Rapid strep +. WIll treat w/ 10D of amox. T#3 given for pain. Return precautions given for new or worsening symptoms including worsening pain, swelling, difficulty swallowing.    Additionally pt has abscess of R inner thigh, but has declined I&D. She will continue warm compresses, return for worsening symptoms.          Shanna CiscoMegan E Docherty, MD 12/20/13 0002

## 2013-12-19 NOTE — ED Notes (Signed)
Pt amb to room 8 with quick steady gait in nad. Pt reports right ear pain and sore throat x yesterday. Denies fevers, reports "some" cough.

## 2013-12-19 NOTE — Discharge Instructions (Signed)
Otitis Media Otitis media is redness, soreness, and swelling (inflammation) of the middle ear. Otitis media may be caused by allergies or, most commonly, by infection. Often it occurs as a complication of the common cold. SIGNS AND SYMPTOMS Symptoms of otitis media may include:  Earache.  Fever.  Ringing in your ear.  Headache.  Leakage of fluid from the ear. DIAGNOSIS To diagnose otitis media, your health care provider will examine your ear with an otoscope. This is an instrument that allows your health care provider to see into your ear in order to examine your eardrum. Your health care provider also will ask you questions about your symptoms. TREATMENT  Typically, otitis media resolves on its own within 3-5 days. Your health care provider may prescribe medicine to ease your symptoms of pain. If otitis media does not resolve within 5 days or is recurrent, your health care provider may prescribe antibiotic medicines if he or she suspects that a bacterial infection is the cause. HOME CARE INSTRUCTIONS   Take your medicine as directed until it is gone, even if you feel better after the first few days.  Only take over-the-counter or prescription medicines for pain, discomfort, or fever as directed by your health care provider.  Follow up with your health care provider as directed. SEEK MEDICAL CARE IF:  You have otitis media only in one ear, or bleeding from your nose, or both.  You notice a lump on your neck.  You are not getting better in 3-5 days.  You feel worse instead of better. SEEK IMMEDIATE MEDICAL CARE IF:   You have pain that is not controlled with medicine.  You have swelling, redness, or pain around your ear or stiffness in your neck.  You notice that part of your face is paralyzed.  You notice that the bone behind your ear (mastoid) is tender when you touch it. MAKE SURE YOU:   Understand these instructions.  Will watch your condition.  Will get help right  away if you are not doing well or get worse. Document Released: 03/25/2004 Document Revised: 06/25/2013 Document Reviewed: 01/15/2013 [facility] Patient Information 2015 [place], [place]. This information is not intended to replace advice given to you by your health care provider. Make sure you discuss any questions you have with your health care provider.  Strep Throat Strep throat is an infection of the throat caused by a bacteria named Streptococcus pyogenes. Your caregiver may call the infection streptococcal "tonsillitis" or "pharyngitis" depending on whether there are signs of inflammation in the tonsils or back of the throat. Strep throat is most common in children aged 5-15 years during the cold months of the year, but it can occur in people of any age during any season. This infection is spread from person to person (contagious) through coughing, sneezing, or other close contact. SYMPTOMS   Fever or chills.  Painful, swollen, red tonsils or throat.  Pain or difficulty when swallowing.  White or yellow spots on the tonsils or throat.  Swollen, tender lymph nodes or "glands" of the neck or under the jaw.  Red rash all over the body (rare). DIAGNOSIS  Many different infections can cause the same symptoms. A test must be done to confirm the diagnosis so the right treatment can be given. A "rapid strep test" can help your caregiver make the diagnosis in a few minutes. If this test is not available, a light swab of the infected area can be used for a throat culture test. If a  throat culture test is done, results are usually available in a day or two. TREATMENT  Strep throat is treated with antibiotic medicine. HOME CARE INSTRUCTIONS   Gargle with 1 tsp of salt in 1 cup of warm water, 3-4 times per day or as needed for comfort.  Family members who also have a sore throat or fever should be tested for strep throat and treated with antibiotics if they have the strep infection.  Make sure  everyone in your household washes their hands well.  Do not share food, drinking cups, or personal items that could cause the infection to spread to others.  You may need to eat a soft food diet until your sore throat gets better.  Drink enough water and fluids to keep your urine clear or pale yellow. This will help prevent dehydration.  Get plenty of rest.  Stay home from school, daycare, or work until you have been on antibiotics for 24 hours.  Only take over-the-counter or prescription medicines for pain, discomfort, or fever as directed by your caregiver.  If antibiotics are prescribed, take them as directed. Finish them even if you start to feel better. SEEK MEDICAL CARE IF:   The glands in your neck continue to enlarge.  You develop a rash, cough, or earache.  You cough up green, yellow-brown, or bloody sputum.  You have pain or discomfort not controlled by medicines.  Your problems seem to be getting worse rather than better. SEEK IMMEDIATE MEDICAL CARE IF:   You develop any new symptoms such as vomiting, severe headache, stiff or painful neck, chest pain, shortness of breath, or trouble swallowing.  You develop severe throat pain, drooling, or changes in your voice.  You develop swelling of the neck, or the skin on the neck becomes red and tender.  You have a fever.  You develop signs of dehydration, such as fatigue, dry mouth, and decreased urination.  You become increasingly sleepy, or you cannot wake up completely. Document Released: 06/17/2000 Document Revised: 06/06/2012 Document Reviewed: 08/19/2010 [facility]Exit[facility] Patient Information 2015 [place], [place]. This information is not intended to replace advice given to you by your health care provider. Make sure you discuss any questions you have with your health care provider.

## 2014-01-28 ENCOUNTER — Encounter (HOSPITAL_BASED_OUTPATIENT_CLINIC_OR_DEPARTMENT_OTHER): Payer: Self-pay | Admitting: Emergency Medicine

## 2014-01-28 ENCOUNTER — Emergency Department (HOSPITAL_BASED_OUTPATIENT_CLINIC_OR_DEPARTMENT_OTHER)
Admission: EM | Admit: 2014-01-28 | Discharge: 2014-01-28 | Disposition: A | Discharge status: Home / Self Care | Payer: Self-pay | Attending: Emergency Medicine | Admitting: Emergency Medicine

## 2014-01-28 DIAGNOSIS — A599 Trichomoniasis, unspecified: Secondary | ICD-10-CM

## 2014-01-28 DIAGNOSIS — B9689 Other specified bacterial agents as the cause of diseases classified elsewhere: Secondary | ICD-10-CM | POA: Insufficient documentation

## 2014-01-28 DIAGNOSIS — A499 Bacterial infection, unspecified: Secondary | ICD-10-CM | POA: Insufficient documentation

## 2014-01-28 DIAGNOSIS — Z792 Long term (current) use of antibiotics: Secondary | ICD-10-CM | POA: Insufficient documentation

## 2014-01-28 DIAGNOSIS — A5901 Trichomonal vulvovaginitis: Secondary | ICD-10-CM | POA: Insufficient documentation

## 2014-01-28 DIAGNOSIS — R110 Nausea: Secondary | ICD-10-CM | POA: Insufficient documentation

## 2014-01-28 DIAGNOSIS — N390 Urinary tract infection, site not specified: Secondary | ICD-10-CM | POA: Insufficient documentation

## 2014-01-28 DIAGNOSIS — N760 Acute vaginitis: Secondary | ICD-10-CM | POA: Insufficient documentation

## 2014-01-28 DIAGNOSIS — F17200 Nicotine dependence, unspecified, uncomplicated: Secondary | ICD-10-CM | POA: Insufficient documentation

## 2014-01-28 DIAGNOSIS — Z906 Acquired absence of other parts of urinary tract: Secondary | ICD-10-CM | POA: Insufficient documentation

## 2014-01-28 DIAGNOSIS — Z3202 Encounter for pregnancy test, result negative: Secondary | ICD-10-CM | POA: Insufficient documentation

## 2014-01-28 DIAGNOSIS — R42 Dizziness and giddiness: Secondary | ICD-10-CM | POA: Insufficient documentation

## 2014-01-28 DIAGNOSIS — N898 Other specified noninflammatory disorders of vagina: Secondary | ICD-10-CM | POA: Insufficient documentation

## 2014-01-28 LAB — URINALYSIS, ROUTINE W REFLEX MICROSCOPIC
Bilirubin Urine: NEGATIVE
Glucose, UA: NEGATIVE mg/dL
Ketones, ur: 15 mg/dL — AB
NITRITE: NEGATIVE
PROTEIN: NEGATIVE mg/dL
Specific Gravity, Urine: 1.026 (ref 1.005–1.030)
UROBILINOGEN UA: 1 mg/dL (ref 0.0–1.0)
pH: 6 (ref 5.0–8.0)

## 2014-01-28 LAB — WET PREP, GENITAL: YEAST WET PREP: NONE SEEN

## 2014-01-28 LAB — URINE MICROSCOPIC-ADD ON

## 2014-01-28 LAB — PREGNANCY, URINE: PREG TEST UR: NEGATIVE

## 2014-01-28 MED ORDER — METRONIDAZOLE 500 MG PO TABS: 500.0000 mg | ORAL_TABLET | Freq: Two times a day (BID) | ORAL | Status: DC

## 2014-01-28 MED ORDER — CIPROFLOXACIN HCL 250 MG PO TABS: 250.0000 mg | ORAL_TABLET | Freq: Two times a day (BID) | ORAL | Status: DC

## 2014-01-28 MED ORDER — CEFTRIAXONE SODIUM 250 MG IJ SOLR
250.0000 mg | Freq: Once | INTRAMUSCULAR | Status: AC
  Administered 2014-01-28: 250 mg via INTRAMUSCULAR
  Filled 2014-01-28: qty 250

## 2014-01-28 MED ORDER — AZITHROMYCIN 250 MG PO TABS
1000.0000 mg | ORAL_TABLET | Freq: Once | ORAL | Status: AC
  Administered 2014-01-28: 1000 mg via ORAL
  Filled 2014-01-28: qty 4

## 2014-01-28 MED ORDER — LIDOCAINE HCL (PF) 1 % IJ SOLN
INTRAMUSCULAR | Status: AC
  Administered 2014-01-28: 1.2 mL
  Filled 2014-01-28: qty 5

## 2014-01-28 MED ORDER — ONDANSETRON 4 MG PO TBDP
4.0000 mg | ORAL_TABLET | Freq: Once | ORAL | Status: AC
  Administered 2014-01-28: 4 mg via ORAL
  Filled 2014-01-28: qty 1

## 2014-01-28 MED ORDER — METRONIDAZOLE 500 MG PO TABS
2000.0000 mg | ORAL_TABLET | ORAL | Status: AC
  Administered 2014-01-28: 2000 mg via ORAL
  Filled 2014-01-28: qty 4

## 2014-01-28 NOTE — ED Notes (Signed)
MD at bedside. 

## 2014-01-28 NOTE — ED Provider Notes (Signed)
CSN: [id number]     Arrival date & time 01/28/14  2126 History   First MD Initiated Contact with Patient 01/28/14 2224     Chief Complaint  Patient presents with  . Vaginal Discharge     (Consider location/radiation/quality/duration/timing/severity/associated sxs/prior Treatment) Patient is a 28 y.o. female presenting with female genitourinary complaint. The history is provided by the patient.  Female GU Problem This is a new problem. The current episode started yesterday. The problem occurs constantly. The problem has not changed since onset.Pertinent negatives include no chest pain, no abdominal pain, no headaches and no shortness of breath. Nothing aggravates the symptoms. Nothing relieves the symptoms. She has tried nothing for the symptoms. The treatment provided no relief.    History reviewed. No pertinent past medical history. Past Surgical History  Procedure Laterality Date  . Skin biopsy    . Cystectomy     No family history on file. History  Substance Use Topics  . Smoking status: Light Tobacco Smoker    Types: Cigarettes  . Smokeless tobacco: Not on file  . Alcohol Use: No   OB History   Grav Para Term Preterm Abortions TAB SAB Ect Mult Living                 Review of Systems  Constitutional: Negative for fever and fatigue.  HENT: Negative for congestion and drooling.   Eyes: Negative for pain.  Respiratory: Negative for cough and shortness of breath.   Cardiovascular: Negative for chest pain.  Gastrointestinal: Positive for nausea. Negative for vomiting, abdominal pain and diarrhea.  Genitourinary: Positive for dysuria and vaginal discharge. Negative for hematuria.  Musculoskeletal: Negative for back pain, gait problem and neck pain.  Skin: Negative for color change.  Neurological: Positive for dizziness. Negative for headaches.  Hematological: Negative for adenopathy.  Psychiatric/Behavioral: Negative for behavioral problems.  All other systems reviewed  and are negative.     Allergies  Review of patient's allergies indicates no known allergies.  Home Medications   Prior to Admission medications   Medication Sig Start Date End Date Taking? Authorizing Provider  acetaminophen-codeine (TYLENOL #3) 300-30 MG per tablet Take 1-2 tablets by mouth every 6 (six) hours as needed for moderate pain. 12/19/13   Shanna Cisco, MD  amoxicillin (AMOXIL) 500 MG capsule Take 2 capsules (1,000 mg total) by mouth 2 (two) times daily. 12/19/13   Shanna Cisco, MD  azithromycin (ZITHROMAX) 250 MG tablet Take 1 tablet (250 mg total) by mouth daily. Take first 2 tablets together, then 1 every day until finished. 09/25/12   Elson Areas, PA-C  benzonatate (TESSALON) 100 MG capsule Take 1 capsule (100 mg total) by mouth every 8 (eight) hours. 09/25/12   Elson Areas, PA-C  ibuprofen (ADVIL,MOTRIN) 200 MG tablet Take 400 mg by mouth every 6 (six) hours as needed. For cramps.    Historical Provider, MD  ibuprofen (ADVIL,MOTRIN) 600 MG tablet Take 1 tablet (600 mg total) by mouth every 6 (six) hours as needed. 10/07/13   Ethelda Chick, MD   BP 127/77  Pulse 86  Temp(Src) 98.8 F (37.1 C) (Oral)  Resp 18  Ht 5\' 4"  (1.626 m)  Wt 213 lb (96.616 kg)  BMI 36.54 kg/m2  SpO2 98%  LMP 12/01/2013 Physical Exam  Nursing note and vitals reviewed. Constitutional: She is oriented to person, place, and time. She appears well-developed and well-nourished.  HENT:  Head: Normocephalic and atraumatic.  Mouth/Throat: Oropharynx is clear and moist.  No oropharyngeal exudate.  Eyes: Conjunctivae and EOM are normal. Pupils are equal, round, and reactive to light.  Neck: Normal range of motion. Neck supple.  Cardiovascular: Normal rate, regular rhythm, normal heart sounds and intact distal pulses.  Exam reveals no gallop and no friction rub.   No murmur heard. Pulmonary/Chest: Effort normal and breath sounds normal. No respiratory distress. She has no wheezes.   Abdominal: Soft. Bowel sounds are normal. There is no tenderness. There is no rebound and no guarding.  Genitourinary:  Normal-appearing external vagina.  Normal-appearing cervix. Os closed. No cervical motion tenderness or adnexal tenderness on bimanual. Small amount of white fluid was noted in the posterior fornix.  Musculoskeletal: Normal range of motion. She exhibits no edema and no tenderness.  Neurological: She is alert and oriented to person, place, and time.  Skin: Skin is warm and dry.  Psychiatric: She has a normal mood and affect. Her behavior is normal.    ED Course  Procedures (including critical care time) Labs Review Labs Reviewed  URINALYSIS, ROUTINE W REFLEX MICROSCOPIC - Abnormal; Notable for the following:    APPearance CLOUDY (*)    Hgb urine dipstick TRACE (*)    Ketones, ur 15 (*)    Leukocytes, UA SMALL (*)    All other components within normal limits  URINE MICROSCOPIC-ADD ON - Abnormal; Notable for the following:    Squamous Epithelial / LPF FEW (*)    All other components within normal limits  GC/CHLAMYDIA PROBE AMP  WET PREP, GENITAL  PREGNANCY, URINE    Imaging Review No results found.   EKG Interpretation None      MDM   Final diagnoses:  BV (bacterial vaginosis)  Trichomoniasis  UTI (lower urinary tract infection)    10:32 PM 28 y.o. female who presents with vaginal discharge which started yesterday. She has also had some mild dysuria. She denies any fevers or pain. Will get urinalysis and perform pelvic exam. The patient is sexually active and has a remote history of Chlamydia.  11:26 PM: Pt found to have trich, will tx empirically for STD's. Will also tx for BV and UTI. I notified her that her partner should be treated for Trichomonas before they have sex again. I have discussed the diagnosis/risks/treatment options with the patient and believe the pt to be eligible for discharge home to follow-up with her pcp as needed. We also  discussed returning to the ED immediately if new or worsening sx occur. We discussed the sx which are most concerning (e.g., continued d/c, pelvic pain, fever) that necessitate immediate return. Medications administered to the patient during their visit and any new prescriptions provided to the patient are listed below.  Medications given during this visit Medications  ondansetron (ZOFRAN-ODT) disintegrating tablet 4 mg (4 mg Oral Given 01/28/14 2229)  cefTRIAXone (ROCEPHIN) injection 250 mg (250 mg Intramuscular Given 01/28/14 2326)  metroNIDAZOLE (FLAGYL) tablet 2,000 mg (2,000 mg Oral Given 01/28/14 2325)  azithromycin (ZITHROMAX) tablet 1,000 mg (1,000 mg Oral Given 01/28/14 2325)  lidocaine (PF) (XYLOCAINE) 1 % injection (1.2 mLs  Given 01/28/14 2326)    New Prescriptions   CIPROFLOXACIN (CIPRO) 250 MG TABLET    Take 1 tablet (250 mg total) by mouth every 12 (twelve) hours.   METRONIDAZOLE (FLAGYL) 500 MG TABLET    Take 1 tablet (500 mg total) by mouth 2 (two) times daily. One po bid x 7 days       Junius ArgyleForrest S Haiven Nardone, MD 01/28/14 2328

## 2014-01-28 NOTE — ED Notes (Signed)
Pt reports cream colored vaginal discharge, not pruritic since yesterday.  Also with nausea and dizziness.

## 2014-01-30 LAB — URINE CULTURE
COLONY COUNT: NO GROWTH
CULTURE: NO GROWTH

## 2014-01-30 LAB — GC/CHLAMYDIA PROBE AMP
CT PROBE, AMP APTIMA: NEGATIVE
GC Probe RNA: NEGATIVE

## 2014-12-24 ENCOUNTER — Emergency Department (HOSPITAL_BASED_OUTPATIENT_CLINIC_OR_DEPARTMENT_OTHER)
Admission: EM | Admit: 2014-12-24 | Discharge: 2014-12-24 | Disposition: A | Discharge status: Home / Self Care | Payer: Self-pay | Attending: Emergency Medicine | Admitting: Emergency Medicine

## 2014-12-24 ENCOUNTER — Encounter (HOSPITAL_BASED_OUTPATIENT_CLINIC_OR_DEPARTMENT_OTHER): Payer: Self-pay | Admitting: *Deleted

## 2014-12-24 DIAGNOSIS — Z792 Long term (current) use of antibiotics: Secondary | ICD-10-CM | POA: Insufficient documentation

## 2014-12-24 DIAGNOSIS — Z3202 Encounter for pregnancy test, result negative: Secondary | ICD-10-CM | POA: Insufficient documentation

## 2014-12-24 DIAGNOSIS — Z720 Tobacco use: Secondary | ICD-10-CM | POA: Insufficient documentation

## 2014-12-24 DIAGNOSIS — B9689 Other specified bacterial agents as the cause of diseases classified elsewhere: Secondary | ICD-10-CM

## 2014-12-24 DIAGNOSIS — N390 Urinary tract infection, site not specified: Secondary | ICD-10-CM | POA: Insufficient documentation

## 2014-12-24 DIAGNOSIS — R319 Hematuria, unspecified: Secondary | ICD-10-CM

## 2014-12-24 DIAGNOSIS — Z79899 Other long term (current) drug therapy: Secondary | ICD-10-CM | POA: Insufficient documentation

## 2014-12-24 DIAGNOSIS — N760 Acute vaginitis: Secondary | ICD-10-CM | POA: Insufficient documentation

## 2014-12-24 LAB — URINE MICROSCOPIC-ADD ON

## 2014-12-24 LAB — URINALYSIS, ROUTINE W REFLEX MICROSCOPIC
BILIRUBIN URINE: NEGATIVE
Glucose, UA: NEGATIVE mg/dL
Ketones, ur: NEGATIVE mg/dL
Nitrite: NEGATIVE
PH: 6 (ref 5.0–8.0)
Protein, ur: NEGATIVE mg/dL
SPECIFIC GRAVITY, URINE: 1.022 (ref 1.005–1.030)
UROBILINOGEN UA: 0.2 mg/dL (ref 0.0–1.0)

## 2014-12-24 LAB — WET PREP, GENITAL
TRICH WET PREP: NONE SEEN
Yeast Wet Prep HPF POC: NONE SEEN

## 2014-12-24 LAB — PREGNANCY, URINE: Preg Test, Ur: NEGATIVE

## 2014-12-24 MED ORDER — METRONIDAZOLE 500 MG PO TABS: 500.0000 mg | ORAL_TABLET | Freq: Two times a day (BID) | ORAL | Status: DC

## 2014-12-24 MED ORDER — CEPHALEXIN 500 MG PO CAPS: 500.0000 mg | ORAL_CAPSULE | Freq: Four times a day (QID) | ORAL | Status: DC

## 2014-12-24 NOTE — ED Notes (Signed)
Pt sts she has been experiencing lower abd pain/pressure when urinating and urinating frequently.

## 2014-12-24 NOTE — ED Provider Notes (Signed)
CSN: [id number]     Arrival date & time 12/24/14  1725 History   First MD Initiated Contact with Patient 12/24/14 1736     Chief Complaint  Patient presents with  . Dysuria     (Consider location/radiation/quality/duration/timing/severity/associated sxs/prior Treatment) HPI Comments: 29 year old G1P0 female complaining of lower abdominal pressure when urinating over the past 2-3 days. Yesterday and today, when she wiped, she noticed a small amount of pink spotting. Denies hematuria. Admits to increased urinary frequency and urgency. No dysuria. Denies vaginal bleeding or discharge. LMP 11/23/2014. Sexually active with one partner and does not use protection. Not on birth control. Took a home pregnancy test 2 weeks ago which was negative. Admits to nausea, with two episodes of vomiting one week ago. No fevers. When she is not urinating, does not have any abdominal pain. States she changed her soap a few days ago.  Patient is a 29 y.o. female presenting with dysuria. The history is provided by the patient.  Dysuria   History reviewed. No pertinent past medical history. Past Surgical History  Procedure Laterality Date  . Skin biopsy    . Cystectomy     No family history on file. History  Substance Use Topics  . Smoking status: Light Tobacco Smoker    Types: Cigarettes  . Smokeless tobacco: Not on file  . Alcohol Use: Yes   OB History    No data available     Review of Systems  Genitourinary: Positive for dysuria, urgency and frequency.       + pressure with urination.  All other systems reviewed and are negative.     Allergies  Review of patient's allergies indicates no known allergies.  Home Medications   Prior to Admission medications   Medication Sig Start Date End Date Taking? Authorizing Provider  acetaminophen-codeine (TYLENOL #3) 300-30 MG per tablet Take 1-2 tablets by mouth every 6 (six) hours as needed for moderate pain. 12/19/13   Toy Cookey, MD   amoxicillin (AMOXIL) 500 MG capsule Take 2 capsules (1,000 mg total) by mouth 2 (two) times daily. 12/19/13   Toy Cookey, MD  azithromycin (ZITHROMAX) 250 MG tablet Take 1 tablet (250 mg total) by mouth daily. Take first 2 tablets together, then 1 every day until finished. 09/25/12   Elson Areas, PA-C  benzonatate (TESSALON) 100 MG capsule Take 1 capsule (100 mg total) by mouth every 8 (eight) hours. 09/25/12   Elson Areas, PA-C  cephALEXin (KEFLEX) 500 MG capsule Take 1 capsule (500 mg total) by mouth 4 (four) times daily. 12/24/14   Kathrynn Speed, PA-C  ciprofloxacin (CIPRO) 250 MG tablet Take 1 tablet (250 mg total) by mouth every 12 (twelve) hours. 01/28/14   Purvis Sheffield, MD  ibuprofen (ADVIL,MOTRIN) 200 MG tablet Take 400 mg by mouth every 6 (six) hours as needed. For cramps.    Historical Provider, MD  ibuprofen (ADVIL,MOTRIN) 600 MG tablet Take 1 tablet (600 mg total) by mouth every 6 (six) hours as needed. 10/07/13   Jerelyn Scott, MD  metroNIDAZOLE (FLAGYL) 500 MG tablet Take 1 tablet (500 mg total) by mouth 2 (two) times daily. One po bid x 7 days 12/24/14   Kathrynn Speed, PA-C   BP 124/86 mmHg  Pulse 83  Temp(Src) 98.5 F (36.9 C) (Oral)  Resp 18  Ht 5\' 4"  (1.626 m)  Wt 213 lb (96.616 kg)  BMI 36.54 kg/m2  SpO2 99%  LMP 11/24/2014 Physical Exam  Constitutional: She is oriented  to person, place, and time. She appears well-developed and well-nourished. No distress.  HENT:  Head: Normocephalic and atraumatic.  Mouth/Throat: Oropharynx is clear and moist.  Eyes: Conjunctivae and EOM are normal.  Neck: Normal range of motion. Neck supple.  Cardiovascular: Normal rate, regular rhythm and normal heart sounds.   Pulmonary/Chest: Effort normal and breath sounds normal. No respiratory distress.  Abdominal: Soft. Bowel sounds are normal. She exhibits no distension. There is no tenderness.  No CVAT.  Genitourinary: Uterus normal. Cervix exhibits no motion tenderness and no  friability. Right adnexum displays no mass, no tenderness and no fullness. Left adnexum displays no mass, no tenderness and no fullness. No tenderness or bleeding in the vagina. Vaginal discharge (clumpy white) found.  Musculoskeletal: Normal range of motion. She exhibits no edema.  Neurological: She is alert and oriented to person, place, and time. No sensory deficit.  Skin: Skin is warm and dry.  Psychiatric: She has a normal mood and affect. Her behavior is normal.  Nursing note and vitals reviewed.   ED Course  Procedures (including critical care time) Labs Review Labs Reviewed  WET PREP, GENITAL - Abnormal; Notable for the following:    Clue Cells Wet Prep HPF POC MODERATE (*)    WBC, Wet Prep HPF POC FEW (*)    All other components within normal limits  URINALYSIS, ROUTINE W REFLEX MICROSCOPIC (NOT AT [facility]) - Abnormal; Notable for the following:    APPearance CLOUDY (*)    Hgb urine dipstick SMALL (*)    Leukocytes, UA MODERATE (*)    All other components within normal limits  URINE MICROSCOPIC-ADD ON - Abnormal; Notable for the following:    Squamous Epithelial / LPF FEW (*)    Bacteria, UA FEW (*)    All other components within normal limits  PREGNANCY, URINE  GC/CHLAMYDIA PROBE AMP ([place]) NOT AT [facility]    Imaging Review No results found.   EKG Interpretation None      MDM   Final diagnoses:  BV (bacterial vaginosis)  Urinary tract infection with hematuria, site unspecified   Nontoxic appearing, NAD. AF VSS. Abdomen is soft and nontender. UA with moderate leukocytes, 11-20 white blood cells and few bacteria. Pelvic exam obtained given that the urine does not have a large infection noted, and the spotting seen on toilet tissue as per patient. Wet prep significant for clue cells. No CMT or adnexal tenderness concerning for PID. Pregnancy negative. Will treat patient with Flagyl for BV and Keflex for UTI. Infection care/precautions discussed. Stable for discharge.  Return precautions given. Patient states understanding of treatment care plan and is agreeable.   Kathrynn Speed, PA-C 12/24/14 1856  Rolan Bucco, MD 12/24/14 [phone]

## 2014-12-24 NOTE — Discharge Instructions (Signed)
Take both antibiotics as directed. It is important to complete the entire course of the antibiotic. Follow-up with your primary care physician. Do not have sexual intercourse for at least one week after completing the antibiotic.  Bacterial Vaginosis Bacterial vaginosis is a vaginal infection that occurs when the normal balance of bacteria in the vagina is disrupted. It results from an overgrowth of certain bacteria. This is the most common vaginal infection in women of childbearing age. Treatment is important to prevent complications, especially in pregnant women, as it can cause a premature delivery. CAUSES  Bacterial vaginosis is caused by an increase in harmful bacteria that are normally present in smaller amounts in the vagina. Several different kinds of bacteria can cause bacterial vaginosis. However, the reason that the condition develops is not fully understood. RISK FACTORS Certain activities or behaviors can put you at an increased risk of developing bacterial vaginosis, including:  Having a new sex partner or multiple sex partners.  Douching.  Using an intrauterine device (IUD) for contraception. Women do not get bacterial vaginosis from toilet seats, bedding, swimming pools, or contact with objects around them. SIGNS AND SYMPTOMS  Some women with bacterial vaginosis have no signs or symptoms. Common symptoms include:  Grey vaginal discharge.  A fishlike odor with discharge, especially after sexual intercourse.  Itching or burning of the vagina and vulva.  Burning or pain with urination. DIAGNOSIS  Your health care provider will take a medical history and examine the vagina for signs of bacterial vaginosis. A sample of vaginal fluid may be taken. Your health care provider will look at this sample under a microscope to check for bacteria and abnormal cells. A vaginal pH test may also be done.  TREATMENT  Bacterial vaginosis may be treated with antibiotic medicines. These may be  given in the form of a pill or a vaginal cream. A second round of antibiotics may be prescribed if the condition comes back after treatment.  HOME CARE INSTRUCTIONS   Only take over-the-counter or prescription medicines as directed by your health care provider.  If antibiotic medicine was prescribed, take it as directed. Make sure you finish it even if you start to feel better.  Do not have sex until treatment is completed.  Tell all sexual partners that you have a vaginal infection. They should see their health care provider and be treated if they have problems, such as a mild rash or itching.  Practice safe sex by using condoms and only having one sex partner. SEEK MEDICAL CARE IF:   Your symptoms are not improving after 3 days of treatment.  You have increased discharge or pain.  You have a fever. MAKE SURE YOU:   Understand these instructions.  Will watch your condition.  Will get help right away if you are not doing well or get worse. FOR MORE INFORMATION  Centers for Disease Control and Prevention, Division of STD Prevention: SolutionApps.co.za American Sexual Health Association (ASHA): www.ashastd.org  Document Released: 06/20/2005 Document Revised: 04/10/2013 Document Reviewed: 01/30/2013 [facility] Patient Information 2015 [place], [place]. This information is not intended to replace advice given to you by your health care provider. Make sure you discuss any questions you have with your health care provider.  Urinary Tract Infection Urinary tract infections (UTIs) can develop anywhere along your urinary tract. Your urinary tract is your body's drainage system for removing wastes and extra water. Your urinary tract includes two kidneys, two ureters, a bladder, and a urethra. Your kidneys are a pair of  bean-shaped organs. Each kidney is about the size of your fist. They are located below your ribs, one on each side of your spine. CAUSES Infections are caused by microbes, which are  microscopic organisms, including fungi, viruses, and bacteria. These organisms are so small that they can only be seen through a microscope. Bacteria are the microbes that most commonly cause UTIs. SYMPTOMS  Symptoms of UTIs may vary by age and gender of the patient and by the location of the infection. Symptoms in young women typically include a frequent and intense urge to urinate and a painful, burning feeling in the bladder or urethra during urination. Older women and men are more likely to be tired, shaky, and weak and have muscle aches and abdominal pain. A fever may mean the infection is in your kidneys. Other symptoms of a kidney infection include pain in your back or sides below the ribs, nausea, and vomiting. DIAGNOSIS To diagnose a UTI, your caregiver will ask you about your symptoms. Your caregiver also will ask to provide a urine sample. The urine sample will be tested for bacteria and white blood cells. White blood cells are made by your body to help fight infection. TREATMENT  Typically, UTIs can be treated with medication. Because most UTIs are caused by a bacterial infection, they usually can be treated with the use of antibiotics. The choice of antibiotic and length of treatment depend on your symptoms and the type of bacteria causing your infection. HOME CARE INSTRUCTIONS  If you were prescribed antibiotics, take them exactly as your caregiver instructs you. Finish the medication even if you feel better after you have only taken some of the medication.  Drink enough water and fluids to keep your urine clear or pale yellow.  Avoid caffeine, tea, and carbonated beverages. They tend to irritate your bladder.  Empty your bladder often. Avoid holding urine for long periods of time.  Empty your bladder before and after sexual intercourse.  After a bowel movement, women should cleanse from front to back. Use each tissue only once. SEEK MEDICAL CARE IF:   You have back pain.  You  develop a fever.  Your symptoms do not begin to resolve within 3 days. SEEK IMMEDIATE MEDICAL CARE IF:   You have severe back pain or lower abdominal pain.  You develop chills.  You have nausea or vomiting.  You have continued burning or discomfort with urination. MAKE SURE YOU:   Understand these instructions.  Will watch your condition.  Will get help right away if you are not doing well or get worse. Document Released: 03/30/2005 Document Revised: 12/20/2011 Document Reviewed: 07/29/2011 [facility] Patient Information 2015 [place], [place]. This information is not intended to replace advice given to you by your health care provider. Make sure you discuss any questions you have with your health care provider.

## 2014-12-25 LAB — GC/CHLAMYDIA PROBE AMP (~~LOC~~) NOT AT ARMC
CHLAMYDIA, DNA PROBE: NEGATIVE
Neisseria Gonorrhea: NEGATIVE

## 2015-01-29 ENCOUNTER — Emergency Department (HOSPITAL_BASED_OUTPATIENT_CLINIC_OR_DEPARTMENT_OTHER)
Admission: EM | Admit: 2015-01-29 | Discharge: 2015-01-29 | Disposition: A | Discharge status: Home / Self Care | Payer: Self-pay | Attending: Emergency Medicine | Admitting: Emergency Medicine

## 2015-01-29 ENCOUNTER — Encounter (HOSPITAL_BASED_OUTPATIENT_CLINIC_OR_DEPARTMENT_OTHER): Payer: Self-pay | Admitting: *Deleted

## 2015-01-29 DIAGNOSIS — K0889 Other specified disorders of teeth and supporting structures: Secondary | ICD-10-CM

## 2015-01-29 DIAGNOSIS — K088 Other specified disorders of teeth and supporting structures: Secondary | ICD-10-CM | POA: Insufficient documentation

## 2015-01-29 DIAGNOSIS — Z792 Long term (current) use of antibiotics: Secondary | ICD-10-CM | POA: Insufficient documentation

## 2015-01-29 DIAGNOSIS — Z720 Tobacco use: Secondary | ICD-10-CM | POA: Insufficient documentation

## 2015-01-29 DIAGNOSIS — K029 Dental caries, unspecified: Secondary | ICD-10-CM | POA: Insufficient documentation

## 2015-01-29 MED ORDER — FLUCONAZOLE 200 MG PO TABS: 200.0000 mg | ORAL_TABLET | Freq: Every day | ORAL | Status: AC

## 2015-01-29 MED ORDER — TRAMADOL HCL 50 MG PO TABS: 50.0000 mg | ORAL_TABLET | Freq: Four times a day (QID) | ORAL | Status: DC | PRN

## 2015-01-29 MED ORDER — AMOXICILLIN 500 MG PO CAPS: 500.0000 mg | ORAL_CAPSULE | Freq: Three times a day (TID) | ORAL | Status: DC

## 2015-01-29 NOTE — Discharge Instructions (Signed)
Dental Pain A tooth ache may be caused by cavities (tooth decay). Cavities expose the nerve of the tooth to air and hot or cold temperatures. It may come from an infection or abscess (also called a boil or furuncle) around your tooth. It is also often caused by dental caries (tooth decay). This causes the pain you are having. DIAGNOSIS  Your caregiver can diagnose this problem by exam. TREATMENT   If caused by an infection, it may be treated with medications which kill germs (antibiotics) and pain medications as prescribed by your caregiver. Take medications as directed.  Only take over-the-counter or prescription medicines for pain, discomfort, or fever as directed by your caregiver.  Whether the tooth ache today is caused by infection or dental disease, you should see your dentist as soon as possible for further care. SEEK MEDICAL CARE IF: The exam and treatment you received today has been provided on an emergency basis only. This is not a substitute for complete medical or dental care. If your problem worsens or new problems (symptoms) appear, and you are unable to meet with your dentist, call or return to this location. SEEK IMMEDIATE MEDICAL CARE IF:   You have a fever.  You develop redness and swelling of your face, jaw, or neck.  You are unable to open your mouth.  You have severe pain uncontrolled by pain medicine. MAKE SURE YOU:   Understand these instructions.  Will watch your condition.  Will get help right away if you are not doing well or get worse. Document Released: 06/20/2005 Document Revised: 09/12/2011 Document Reviewed: 02/06/2008 [facility] Patient Information 2015 [place], [place]. This information is not intended to replace advice given to you by your health care provider. Make sure you discuss any questions you have with your health care provider. Wisdom Teeth Wisdom teeth are the last set of molars to grow in at the back of the mouth. A wisdom tooth can grow in  each of the four back areas of the mouth, the:  Top right.  Top left.  Bottom right.  Bottom left. The 4 molars usually appear during later adolescence, between the ages of 7 and 89. A small percentage of people are born with one or more missing wisdom teeth or have none at all. Wisdom teeth may become painful if:  There is not enough room in the mouth for them to emerge.  They are misaligned.  Infection develops. With proper alignment and adequate space, healthy wisdom teeth can be an asset. CAUSES Often there is not enough room in the back of the jaw for the wisdom teeth. They can become trapped inside the gum (impacted). They may also grow sideways or only partially erupt. SYMPTOMS The presence of wisdom teeth or impacted wisdom teeth may not cause any symptoms. However, problems with one or more wisdom teeth may result in:  Pain.  Swelling around the tooth.  Stiff jaw.  General feeling of illness.  Inability to fully open your mouth (trismus).  Bad breath or unpleasant taste in your mouth. Impacted teeth may increase the risk of:  Infection.  Damage to adjacent teeth.  Growth of cysts (formed when the sac surrounding the impacted tooth becomes filled with fluid and enlarges). DIAGNOSIS  Oral exam.  X-rays. TREATMENT Your dentist or oral surgeon will recommend the best course of action for you. This may include surgical removal (extraction) of the wisdom teeth. Extraction is generally recommended to avoid complications. Removal and recovery time is easier if done in  in early adulthood since the roots of the teeth are smaller at this time and surrounding bone is softer. Sometimes your dentist or oral surgeon may recommend extraction before symptoms develop. This is done to avoid a more painful or complicated extraction at a later date. °HOME CARE INSTRUCTIONS °· Follow up with your caregiver as directed.   °· If your wisdom teeth are causing pain or other  symptoms: °¨ Only take over-the-counter or prescription medicines for pain, discomfort, or fever as directed by your caregiver.     °¨ Ice the affected area for 20 minutes at a time, 3 to 4 times per day. Place a towel on the skin over the painful area and the ice or cold pack over the towel. Do not place ice directly on the skin. °¨ Eat soft foods or a liquid diet.   °¨ Mild or moderate fevers generally have no long-term effects and often do not require treatment. °PROGNOSIS °Most patients recover fully from tooth extraction with proper care and pain management.  °SEEK DENTAL DENTAL CARE IF : °· Pain emerges, worsens, or is not controlled by the medicine you have been given. °· Bleeding occurs. °SEEK IMMEDIATE DENTAL CARE IF: °· You have a fever or persistent symptoms for more than 72 hours. °· You have a fever and your symptoms suddenly get worse. °FOR MORE INFORMATION °· American Dental Association: http://www.ada.org/ °· American Association of Oral and Maxillofacial Surgeons: http://www.myoms.org/ °MAKE SURE YOU: °· Understand these instructions. °· Will watch your condition. °· Will get help right away if you are not doing well or get worse. °Document Released: 07/23/2010 Document Revised: 11/04/2013 Document Reviewed: 07/23/2010 °[employer]® Patient Information ©2015 [employer]. This information is not intended to replace advice given to you by your health care provider. Make sure you discuss any questions you have with your health care provider. ° °

## 2015-01-29 NOTE — ED Notes (Signed)
Dental pain and ear pain in her right ear since yesterday.

## 2015-01-29 NOTE — ED Provider Notes (Signed)
CSN: [id number]     Arrival date & time 01/29/15  1737 History   First MD Initiated Contact with Patient 01/29/15 1743     Chief Complaint  Patient presents with  . Dental Pain     (Consider location/radiation/quality/duration/timing/severity/associated sxs/prior Treatment) HPI    PCP: No PCP Per Patient Blood pressure 152/78, pulse 88, temperature 98.4 F (36.9 C), temperature source Oral, resp. rate 18, height 5\' 4"  (1.626 m), weight 213 lb (96.616 kg), last menstrual period 01/02/2015, SpO2 100 %.  Bridget Griffin is a 29 y.o.female without any significant PMH presents to the ER with complaints of dental pain. Symptoms began two days ago. The patient has tried to alleviate pain with OTC medications.  Pain rated at a 6/10, characterized as throbbing in nature and located right upper wisdom tooth location. Patient denies fever, night sweats, chills, difficulty swallowing or opening mouth, SOB, nuchal rigidity or decreased ROM of neck.  Patient does not have a dentist and requests a resource guide at discharge.   The patient denies diaphoresis, fever, headache, weakness (general or focal), confusion, change of vision,  neck pain, dysphagia, aphagia, chest pain, shortness of breath,  back pain, abdominal pains, nausea, vomiting, diarrhea, lower extremity swelling, rash.      History reviewed. No pertinent past medical history. Past Surgical History  Procedure Laterality Date  . Skin biopsy    . Cystectomy     No family history on file. History  Substance Use Topics  . Smoking status: Light Tobacco Smoker    Types: Cigarettes  . Smokeless tobacco: Not on file  . Alcohol Use: Yes   OB History    No data available     Review of Systems  10 Systems reviewed and are negative for acute change except as noted in the HPI.   Allergies  Review of patient's allergies indicates no known allergies.  Home Medications   Prior to Admission medications   Medication Sig Start Date  End Date Taking? Authorizing Provider  acetaminophen-codeine (TYLENOL #3) 300-30 MG per tablet Take 1-2 tablets by mouth every 6 (six) hours as needed for moderate pain. 12/19/13   Toy Cookey, MD  amoxicillin (AMOXIL) 500 MG capsule Take 2 capsules (1,000 mg total) by mouth 2 (two) times daily. 12/19/13   Toy Cookey, MD  amoxicillin (AMOXIL) 500 MG capsule Take 1 capsule (500 mg total) by mouth 3 (three) times daily. 01/29/15   Bradley [place] Neva Seat, PA-C  azithromycin (ZITHROMAX) 250 MG tablet Take 1 tablet (250 mg total) by mouth daily. Take first 2 tablets together, then 1 every day until finished. 09/25/12   Elson Areas, PA-C  benzonatate (TESSALON) 100 MG capsule Take 1 capsule (100 mg total) by mouth every 8 (eight) hours. 09/25/12   Elson Areas, PA-C  cephALEXin (KEFLEX) 500 MG capsule Take 1 capsule (500 mg total) by mouth 4 (four) times daily. 12/24/14   Kathrynn Speed, PA-C  ciprofloxacin (CIPRO) 250 MG tablet Take 1 tablet (250 mg total) by mouth every 12 (twelve) hours. 01/28/14   Purvis Sheffield, MD  fluconazole (DIFLUCAN) 200 MG tablet Take 1 tablet (200 mg total) by mouth daily. 01/29/15 02/05/15  Marlon Pel, PA-C  ibuprofen (ADVIL,MOTRIN) 200 MG tablet Take 400 mg by mouth every 6 (six) hours as needed. For cramps.    Historical Provider, MD  ibuprofen (ADVIL,MOTRIN) 600 MG tablet Take 1 tablet (600 mg total) by mouth every 6 (six) hours as needed. 10/07/13   Jerelyn Scott, MD  metroNIDAZOLE (FLAGYL) 500 MG tablet Take 1 tablet (500 mg total) by mouth 2 (two) times daily. One po bid x 7 days 12/24/14   Kathrynn Speed, PA-C  traMADol (ULTRAM) 50 MG tablet Take 1 tablet (50 mg total) by mouth every 6 (six) hours as needed. 01/29/15   Marasia Newhall Neva Seat, PA-C   BP 152/78 mmHg  Pulse 88  Temp(Src) 98.4 F (36.9 C) (Oral)  Resp 18  Ht  (1.626 m)  Wt 213 lb (96.616 kg)  BMI 36.54 kg/m2  SpO2 100%  LMP 01/02/2015 Physical Exam  Constitutional: She appears well-developed and  well-nourished. No distress.  HENT:  Head: Normocephalic and atraumatic.  Right Ear: Tympanic membrane and ear canal normal.  Left Ear: Tympanic membrane and ear canal normal.  Mouth/Throat: Uvula is midline, oropharynx is clear and moist and mucous membranes are normal. Normal dentition. Dental caries (Pts tooth shows no obvious abscess but moderate to severe tenderness to palpation of marked tooth) present. No uvula swelling.    Tooth appears to be erupting from right upper gumline near the wisdom tooth location.  Eyes: Pupils are equal, round, and reactive to light.  Neck: Trachea normal, normal range of motion and full passive range of motion without pain. Neck supple.  Cardiovascular: Normal rate, regular rhythm, normal heart sounds and normal pulses.   Pulmonary/Chest: Effort normal and breath sounds normal. No respiratory distress. Chest wall is not dull to percussion. She exhibits no tenderness, no crepitus, no edema, no deformity and no retraction.  Abdominal: Normal appearance.  Musculoskeletal: Normal range of motion.  Neurological: She is alert. She has normal strength.  Skin: Skin is warm, dry and intact. She is not diaphoretic.  Psychiatric: She has a normal mood and affect. Her speech is normal. Cognition and memory are normal.    ED Course  Procedures (including critical care time) Labs Review Labs Reviewed - No data to display  Imaging Review No results found.   EKG Interpretation None      MDM   Final diagnoses:  Pain, dental    Rx: Amoxicillin, Diflucan and Ultram   No emergent s/sx's present. Patent airway. No trismus.  No neck tenderness or protrusion of tongue or floor of mouth.  Referral to Maxillofacial surgery due to possible wisdom tooth growing in. No sign of abscess, airway compromise. No systemic symptoms associated.  Medications - No data to display  29 y.o.[name]'s evaluation in the Emergency Department is complete. It has been  determined that no acute conditions requiring further emergency intervention are present at this time. The patient/guardian have been advised of the diagnosis and plan. We have discussed signs and symptoms that warrant return to the ED, such as changes or worsening in symptoms.  Vital signs are stable at discharge. Filed Vitals:   01/29/15 1741  BP: 152/78  Pulse: 88  Temp: 98.4 F (36.9 C)  Resp: 18    Patient/guardian has voiced understanding and agreed to follow-up with the PCP or specialist.     Marlon Pel, PA-C 01/29/15 1809  Purvis Sheffield, MD 01/30/15 1610

## 2015-06-02 ENCOUNTER — Emergency Department (HOSPITAL_BASED_OUTPATIENT_CLINIC_OR_DEPARTMENT_OTHER)
Admission: EM | Admit: 2015-06-02 | Discharge: 2015-06-02 | Disposition: A | Discharge status: Home / Self Care | Payer: Self-pay | Attending: Emergency Medicine | Admitting: Emergency Medicine

## 2015-06-02 ENCOUNTER — Encounter (HOSPITAL_BASED_OUTPATIENT_CLINIC_OR_DEPARTMENT_OTHER): Payer: Self-pay | Admitting: Emergency Medicine

## 2015-06-02 DIAGNOSIS — R21 Rash and other nonspecific skin eruption: Secondary | ICD-10-CM | POA: Insufficient documentation

## 2015-06-02 DIAGNOSIS — F17210 Nicotine dependence, cigarettes, uncomplicated: Secondary | ICD-10-CM | POA: Insufficient documentation

## 2015-06-02 DIAGNOSIS — T39315A Adverse effect of propionic acid derivatives, initial encounter: Secondary | ICD-10-CM | POA: Insufficient documentation

## 2015-06-02 DIAGNOSIS — T7840XA Allergy, unspecified, initial encounter: Secondary | ICD-10-CM

## 2015-06-02 DIAGNOSIS — Z792 Long term (current) use of antibiotics: Secondary | ICD-10-CM | POA: Insufficient documentation

## 2015-06-02 MED ORDER — METHYLPREDNISOLONE SODIUM SUCC 125 MG IJ SOLR
125.0000 mg | Freq: Once | INTRAMUSCULAR | Status: AC
Start: 2015-06-02 — End: 2015-06-02
  Administered 2015-06-02: 125 mg via INTRAMUSCULAR
  Filled 2015-06-02: qty 2

## 2015-06-02 NOTE — ED Provider Notes (Signed)
CSN: [id number]     Arrival date & time 06/02/15  1357 History   First MD Initiated Contact with Patient 06/02/15 1432     Chief Complaint  Patient presents with  . Allergic Reaction     (Consider location/radiation/quality/duration/timing/severity/associated sxs/prior Treatment) Patient is a 29 y.o. female presenting with allergic reaction. The history is provided by the patient. No language interpreter was used.  Allergic Reaction Presenting symptoms: itching and rash   Severity:  Moderate Prior allergic episodes:  No prior episodes Context: medications   Relieved by:  Antihistamines   History reviewed. No pertinent past medical history. Past Surgical History  Procedure Laterality Date  . Skin biopsy    . Cystectomy     History reviewed. No pertinent family history. Social History  Substance Use Topics  . Smoking status: Light Tobacco Smoker    Types: Cigarettes  . Smokeless tobacco: None  . Alcohol Use: Yes   OB History    No data available     Review of Systems  Skin: Positive for itching and rash.  All other systems reviewed and are negative.     Allergies  Review of patient's allergies indicates no known allergies.  Home Medications   Prior to Admission medications   Medication Sig Start Date End Date Taking? Authorizing Provider  acetaminophen-codeine (TYLENOL #3) 300-30 MG per tablet Take 1-2 tablets by mouth every 6 (six) hours as needed for moderate pain. 12/19/13   Toy Cookey, MD  amoxicillin (AMOXIL) 500 MG capsule Take 2 capsules (1,000 mg total) by mouth 2 (two) times daily. 12/19/13   Toy Cookey, MD  amoxicillin (AMOXIL) 500 MG capsule Take 1 capsule (500 mg total) by mouth 3 (three) times daily. 01/29/15   Tiffany Neva Seat, PA-C  azithromycin (ZITHROMAX) 250 MG tablet Take 1 tablet (250 mg total) by mouth daily. Take first 2 tablets together, then 1 every day until finished. 09/25/12   Elson Areas, PA-C  benzonatate (TESSALON) 100 MG  capsule Take 1 capsule (100 mg total) by mouth every 8 (eight) hours. 09/25/12   Elson Areas, PA-C  cephALEXin (KEFLEX) 500 MG capsule Take 1 capsule (500 mg total) by mouth 4 (four) times daily. 12/24/14   Kathrynn Speed, PA-C  ciprofloxacin (CIPRO) 250 MG tablet Take 1 tablet (250 mg total) by mouth every 12 (twelve) hours. 01/28/14   Purvis Sheffield, MD  ibuprofen (ADVIL,MOTRIN) 200 MG tablet Take 400 mg by mouth every 6 (six) hours as needed. For cramps.    Historical Provider, MD  ibuprofen (ADVIL,MOTRIN) 600 MG tablet Take 1 tablet (600 mg total) by mouth every 6 (six) hours as needed. 10/07/13   Jerelyn Scott, MD  metroNIDAZOLE (FLAGYL) 500 MG tablet Take 1 tablet (500 mg total) by mouth 2 (two) times daily. One po bid x 7 days 12/24/14   Kathrynn Speed, PA-C  traMADol (ULTRAM) 50 MG tablet Take 1 tablet (50 mg total) by mouth every 6 (six) hours as needed. 01/29/15   Tiffany Neva Seat, PA-C   BP 134/68 mmHg  Pulse 75  Temp(Src) 98.3 F (36.8 C) (Oral)  Resp 16  Ht  (1.626 m)  Wt 96.616 kg  BMI 36.54 kg/m2 Physical Exam  Constitutional: She is oriented to person, place, and time. She appears well-developed and well-nourished.  HENT:  Right Ear: External ear normal.  Left Ear: External ear normal.  No oral swelling  Cardiovascular: Normal rate and regular rhythm.   Pulmonary/Chest: Effort normal and breath sounds normal.  She has no wheezes.  Neurological: She is alert and oriented to person, place, and time.  Skin:  Hives noted to face and neck  Nursing note and vitals reviewed.   ED Course  Procedures (including critical care time) Labs Review Labs Reviewed - No data to display  Imaging Review No results found. I have personally reviewed and evaluated these images and lab results as part of my medical decision-making.   EKG Interpretation None      MDM   Final diagnoses:  Allergic reaction, initial encounter    Hives are resolving. No airway compromise. Discussed  with pt to not take naproxen and also that she may need more benadryl    Teressa LowerVrinda Makenley Shimp, NP 06/02/15 1558  Benjiman CoreNathan Adryanna Friedt, MD 06/03/15 [phone]

## 2015-06-02 NOTE — ED Notes (Signed)
Patient states that she took one naproxen and now her face and neck have hives.

## 2015-06-02 NOTE — Discharge Instructions (Signed)

## 2015-08-23 ENCOUNTER — Encounter (HOSPITAL_BASED_OUTPATIENT_CLINIC_OR_DEPARTMENT_OTHER): Payer: Self-pay | Admitting: *Deleted

## 2015-08-23 ENCOUNTER — Emergency Department (HOSPITAL_BASED_OUTPATIENT_CLINIC_OR_DEPARTMENT_OTHER): Payer: Self-pay

## 2015-08-23 ENCOUNTER — Emergency Department (HOSPITAL_BASED_OUTPATIENT_CLINIC_OR_DEPARTMENT_OTHER)
Admission: EM | Admit: 2015-08-23 | Discharge: 2015-08-23 | Disposition: A | Discharge status: Home / Self Care | Payer: Self-pay | Attending: Emergency Medicine | Admitting: Emergency Medicine

## 2015-08-23 DIAGNOSIS — Z792 Long term (current) use of antibiotics: Secondary | ICD-10-CM | POA: Insufficient documentation

## 2015-08-23 DIAGNOSIS — F17210 Nicotine dependence, cigarettes, uncomplicated: Secondary | ICD-10-CM | POA: Insufficient documentation

## 2015-08-23 DIAGNOSIS — B9789 Other viral agents as the cause of diseases classified elsewhere: Secondary | ICD-10-CM

## 2015-08-23 DIAGNOSIS — J069 Acute upper respiratory infection, unspecified: Secondary | ICD-10-CM | POA: Insufficient documentation

## 2015-08-23 NOTE — ED Provider Notes (Signed)
CSN: [id number]     Arrival date & time 08/23/15  1229 History   First MD Initiated Contact with Patient 08/23/15 1325     Chief Complaint  Patient presents with  . Cough  . Nasal Congestion     (Consider location/radiation/quality/duration/timing/severity/associated sxs/prior Treatment) Patient is a 30 y.o. female presenting with cough. The history is provided by the patient.  Cough Cough characteristics:  Non-productive Severity:  Mild Onset quality:  Gradual Duration:  6 days Timing:  Constant Progression:  Waxing and waning Chronicity:  New Smoker: no   Context: upper respiratory infection   Relieved by:  Nothing Worsened by:  Nothing tried Ineffective treatments: Tylenol sinus. Associated symptoms: chills, fever and rhinorrhea   Associated symptoms: no shortness of breath     History reviewed. No pertinent past medical history. Past Surgical History  Procedure Laterality Date  . Skin biopsy    . Cystectomy     No family history on file. Social History  Substance Use Topics  . Smoking status: Light Tobacco Smoker    Types: Cigarettes  . Smokeless tobacco: Never Used  . Alcohol Use: Yes     Comment: 4 drinks/week   OB History    No data available     Review of Systems  Constitutional: Positive for fever and chills.  HENT: Positive for rhinorrhea.   Respiratory: Positive for cough. Negative for shortness of breath.   All other systems reviewed and are negative.     Allergies  Naproxen  Home Medications   Prior to Admission medications   Medication Sig Start Date End Date Taking? Authorizing Provider  acetaminophen-codeine (TYLENOL #3) 300-30 MG per tablet Take 1-2 tablets by mouth every 6 (six) hours as needed for moderate pain. 12/19/13   Toy Cookey, MD  amoxicillin (AMOXIL) 500 MG capsule Take 2 capsules (1,000 mg total) by mouth 2 (two) times daily. 12/19/13   Toy Cookey, MD  amoxicillin (AMOXIL) 500 MG capsule Take 1 capsule (500 mg total)  by mouth 3 (three) times daily. 01/29/15   Tiffany Neva Seat, PA-C  azithromycin (ZITHROMAX) 250 MG tablet Take 1 tablet (250 mg total) by mouth daily. Take first 2 tablets together, then 1 every day until finished. 09/25/12   Elson Areas, PA-C  benzonatate (TESSALON) 100 MG capsule Take 1 capsule (100 mg total) by mouth every 8 (eight) hours. 09/25/12   Elson Areas, PA-C  cephALEXin (KEFLEX) 500 MG capsule Take 1 capsule (500 mg total) by mouth 4 (four) times daily. 12/24/14   Kathrynn Speed, PA-C  ciprofloxacin (CIPRO) 250 MG tablet Take 1 tablet (250 mg total) by mouth every 12 (twelve) hours. 01/28/14   Purvis Sheffield, MD  ibuprofen (ADVIL,MOTRIN) 200 MG tablet Take 400 mg by mouth every 6 (six) hours as needed. For cramps.    Historical Provider, MD  ibuprofen (ADVIL,MOTRIN) 600 MG tablet Take 1 tablet (600 mg total) by mouth every 6 (six) hours as needed. 10/07/13   Jerelyn Scott, MD  metroNIDAZOLE (FLAGYL) 500 MG tablet Take 1 tablet (500 mg total) by mouth 2 (two) times daily. One po bid x 7 days 12/24/14   Kathrynn Speed, PA-C  traMADol (ULTRAM) 50 MG tablet Take 1 tablet (50 mg total) by mouth every 6 (six) hours as needed. 01/29/15   Tiffany Neva Seat, PA-C   BP 112/80 mmHg  Pulse 83  Temp(Src) 99 F (37.2 C) (Oral)  Resp 18  Ht  (1.626 m)  Wt 220 lb (99.791 kg)  BMI 37.74 kg/m2  SpO2 100%  LMP 08/09/2015 Physical Exam  Constitutional: She is oriented to person, place, and time. She appears well-developed and well-nourished. No distress.  HENT:  Head: Normocephalic.  Eyes: Conjunctivae are normal.  Neck: Neck supple. No tracheal deviation present.  Cardiovascular: Normal rate and regular rhythm.   Pulmonary/Chest: Effort normal. No respiratory distress.  Abdominal: Soft. She exhibits no distension.  Neurological: She is alert and oriented to person, place, and time.  Skin: Skin is warm and dry.  Psychiatric: She has a normal mood and affect.    ED Course  Procedures (including  critical care time) Labs Review Labs Reviewed - No data to display  Imaging Review Dg Chest 2 View  08/23/2015  CLINICAL DATA:  Cough EXAM: CHEST  2 VIEW COMPARISON:  09/25/2012 chest radiograph. FINDINGS: Stable cardiomediastinal silhouette with normal heart size. No pneumothorax. No pleural effusion. Lungs appear clear, with no acute consolidative airspace disease and no pulmonary edema. IMPRESSION: No active cardiopulmonary disease. Electronically Signed   By: Delbert Phenix M.D.   On: 08/23/2015 13:15   I have personally reviewed and evaluated these images and lab results as part of my medical decision-making.   EKG Interpretation None      MDM   Final diagnoses:  Viral URI with cough   30 y.o. female presents with cough and congestion over the last 6 days with low-grade fever and body aches. Afebrile with stable vital signs here, temperature triage shows no evidence of pneumonia. Plan to follow up with PCP as needed and return precautions discussed for worsening or new concerning symptoms.     Lyndal Pulley, MD 08/23/15 [phone]

## 2015-08-23 NOTE — ED Notes (Signed)
Cough, congestion since Tuesday. Low grade temp 99

## 2015-08-23 NOTE — ED Notes (Signed)
Patient transported to X-ray 

## 2015-08-23 NOTE — Discharge Instructions (Signed)
Upper Respiratory Infection, Adult Most upper respiratory infections (URIs) are a viral infection of the air passages leading to the lungs. A URI affects the nose, throat, and upper air passages. The most common type of URI is nasopharyngitis and is typically referred to as "the common cold." URIs run their course and usually go away on their own. Most of the time, a URI does not require medical attention, but sometimes a bacterial infection in the upper airways can follow a viral infection. This is called a secondary infection. Sinus and middle ear infections are common types of secondary upper respiratory infections. Bacterial pneumonia can also complicate a URI. A URI can worsen asthma and chronic obstructive pulmonary disease (COPD). Sometimes, these complications can require emergency medical care and may be life threatening.  CAUSES Almost all URIs are caused by viruses. A virus is a type of germ and can spread from one person to another.  RISKS FACTORS You may be at risk for a URI if:   You smoke.   You have chronic heart or lung disease.  You have a weakened defense (immune) system.   You are very young or very old.   You have nasal allergies or asthma.  You work in crowded or poorly ventilated areas.  You work in health care facilities or schools. SIGNS AND SYMPTOMS  Symptoms typically develop 2-3 days after you come in contact with a cold virus. Most viral URIs last 7-10 days. However, viral URIs from the influenza virus (flu virus) can last 14-18 days and are typically more severe. Symptoms may include:   Runny or stuffy (congested) nose.   Sneezing.   Cough.   Sore throat.   Headache.   Fatigue.   Fever.   Loss of appetite.   Pain in your forehead, behind your eyes, and over your cheekbones (sinus pain).  Muscle aches.  DIAGNOSIS  Your health care provider may diagnose a URI by:  Physical exam.  Tests to check that your symptoms are not due to  another condition such as:  Strep throat.  Sinusitis.  Pneumonia.  Asthma. TREATMENT  A URI goes away on its own with time. It cannot be cured with medicines, but medicines may be prescribed or recommended to relieve symptoms. Medicines may help:  Reduce your fever.  Reduce your cough.  Relieve nasal congestion. HOME CARE INSTRUCTIONS   Take medicines only as directed by your health care provider.   Gargle warm saltwater or take cough drops to comfort your throat as directed by your health care provider.  Use a warm mist humidifier or inhale steam from a shower to increase air moisture. This may make it easier to breathe.  Drink enough fluid to keep your urine clear or pale yellow.   Eat soups and other clear broths and maintain good nutrition.   Rest as needed.   Return to work when your temperature has returned to normal or as your health care provider advises. You may need to stay home longer to avoid infecting others. You can also use a face mask and careful hand washing to prevent spread of the virus.  Increase the usage of your inhaler if you have asthma.   Do not use any tobacco products, including cigarettes, chewing tobacco, or electronic cigarettes. If you need help quitting, ask your health care provider. PREVENTION  The best way to protect yourself from getting a cold is to practice good hygiene.   Avoid oral or hand contact with people with cold   symptoms.   Wash your hands often if contact occurs.  There is no clear evidence that vitamin C, vitamin E, echinacea, or exercise reduces the chance of developing a cold. However, it is always recommended to get plenty of rest, exercise, and practice good nutrition.  SEEK MEDICAL CARE IF:   You are getting worse rather than better.   Your symptoms are not controlled by medicine.   You have chills.  You have worsening shortness of breath.  You have brown or red mucus.  You have yellow or brown nasal  discharge.  You have pain in your face, especially when you bend forward.  You have a fever.  You have swollen neck glands.  You have pain while swallowing.  You have white areas in the back of your throat. SEEK IMMEDIATE MEDICAL CARE IF:   You have severe or persistent:  Headache.  Ear pain.  Sinus pain.  Chest pain.  You have chronic lung disease and any of the following:  Wheezing.  Prolonged cough.  Coughing up blood.  A change in your usual mucus.  You have a stiff neck.  You have changes in your:  Vision.  Hearing.  Thinking.  Mood. MAKE SURE YOU:   Understand these instructions.  Will watch your condition.  Will get help right away if you are not doing well or get worse.   This information is not intended to replace advice given to you by your health care provider. Make sure you discuss any questions you have with your health care provider.   Document Released: 12/14/2000 Document Revised: 11/04/2014 Document Reviewed: 09/25/2013 Elsevier Interactive Patient Education 2016 [employer]  

## 2015-08-23 NOTE — ED Notes (Signed)
D/c home with note for work

## 2015-10-04 ENCOUNTER — Encounter (HOSPITAL_BASED_OUTPATIENT_CLINIC_OR_DEPARTMENT_OTHER): Payer: Self-pay | Admitting: *Deleted

## 2015-10-04 ENCOUNTER — Emergency Department (HOSPITAL_BASED_OUTPATIENT_CLINIC_OR_DEPARTMENT_OTHER)
Admission: EM | Admit: 2015-10-04 | Discharge: 2015-10-04 | Disposition: A | Discharge status: Home / Self Care | Payer: Self-pay | Attending: Emergency Medicine | Admitting: Emergency Medicine

## 2015-10-04 DIAGNOSIS — F17210 Nicotine dependence, cigarettes, uncomplicated: Secondary | ICD-10-CM | POA: Insufficient documentation

## 2015-10-04 DIAGNOSIS — Z792 Long term (current) use of antibiotics: Secondary | ICD-10-CM | POA: Insufficient documentation

## 2015-10-04 DIAGNOSIS — Z3202 Encounter for pregnancy test, result negative: Secondary | ICD-10-CM | POA: Insufficient documentation

## 2015-10-04 DIAGNOSIS — K529 Noninfective gastroenteritis and colitis, unspecified: Secondary | ICD-10-CM | POA: Insufficient documentation

## 2015-10-04 LAB — COMPREHENSIVE METABOLIC PANEL
ALT: 39 U/L (ref 14–54)
AST: 31 U/L (ref 15–41)
Albumin: 4.3 g/dL (ref 3.5–5.0)
Alkaline Phosphatase: 82 U/L (ref 38–126)
Anion gap: 8 (ref 5–15)
BUN: 13 mg/dL (ref 6–20)
CO2: 24 mmol/L (ref 22–32)
Calcium: 9 mg/dL (ref 8.9–10.3)
Chloride: 105 mmol/L (ref 101–111)
Creatinine, Ser: 1.13 mg/dL — ABNORMAL HIGH (ref 0.44–1.00)
GFR calc Af Amer: >60 mL/min (ref >60)
GFR calc non Af Amer: >60 mL/min (ref >60)
Glucose, Bld: 101 mg/dL — ABNORMAL HIGH (ref 65–99)
Potassium: 3.6 mmol/L (ref 3.5–5.1)
Sodium: 137 mmol/L (ref 135–145)
Total Bilirubin: 1 mg/dL (ref 0.3–1.2)
Total Protein: 8.2 g/dL — ABNORMAL HIGH (ref 6.5–8.1)

## 2015-10-04 LAB — CBC WITH DIFFERENTIAL/PLATELET
Basophils Absolute: 0 10*3/uL (ref 0.0–0.1)
Basophils Relative: 0 %
Eosinophils Absolute: 0.1 10*3/uL (ref 0.0–0.7)
Eosinophils Relative: 1 %
HEMATOCRIT: 40.5 % (ref 36.0–46.0)
HEMOGLOBIN: 13.3 g/dL (ref 12.0–15.0)
LYMPHS PCT: 28 %
Lymphs Abs: 1.8 10*3/uL (ref 0.7–4.0)
MCH: 26.3 pg (ref 26.0–34.0)
MCHC: 32.8 g/dL (ref 30.0–36.0)
MCV: 80.2 fL (ref 78.0–100.0)
MONO ABS: 0.5 10*3/uL (ref 0.1–1.0)
MONOS PCT: 8 %
NEUTROS PCT: 63 %
Neutro Abs: 4.1 10*3/uL (ref 1.7–7.7)
Platelets: 307 10*3/uL (ref 150–400)
RBC: 5.05 MIL/uL (ref 3.87–5.11)
RDW: 14.8 % (ref 11.5–15.5)
WBC: 6.5 10*3/uL (ref 4.0–10.5)

## 2015-10-04 LAB — URINALYSIS, ROUTINE W REFLEX MICROSCOPIC
Bilirubin Urine: NEGATIVE
Glucose, UA: NEGATIVE mg/dL
Hgb urine dipstick: NEGATIVE
Ketones, ur: NEGATIVE mg/dL
Leukocytes, UA: NEGATIVE
Nitrite: NEGATIVE
Protein, ur: NEGATIVE mg/dL
Specific Gravity, Urine: 1.031 — ABNORMAL HIGH (ref 1.005–1.030)
pH: 6 (ref 5.0–8.0)

## 2015-10-04 LAB — PREGNANCY, URINE: Preg Test, Ur: NEGATIVE

## 2015-10-04 MED ORDER — SODIUM CHLORIDE 0.9 % IV SOLN: INTRAVENOUS | Status: DC

## 2015-10-04 MED ORDER — ONDANSETRON 8 MG PO TBDP: 8.0000 mg | ORAL_TABLET | Freq: Three times a day (TID) | ORAL | Status: DC | PRN

## 2015-10-04 MED ORDER — ONDANSETRON HCL 4 MG/2ML IJ SOLN
4.0000 mg | Freq: Once | INTRAMUSCULAR | Status: AC
  Administered 2015-10-04: 4 mg via INTRAVENOUS
  Filled 2015-10-04: qty 2

## 2015-10-04 MED ORDER — SODIUM CHLORIDE 0.9 % IV BOLUS (SEPSIS)
1000.0000 mL | Freq: Once | INTRAVENOUS | Status: AC
  Administered 2015-10-04: 1000 mL via INTRAVENOUS

## 2015-10-04 NOTE — ED Notes (Signed)
Patient c/o vomiting and diarrhea since yesterday.

## 2015-10-04 NOTE — Discharge Instructions (Signed)

## 2015-10-04 NOTE — ED Notes (Signed)
Pt talking on phone, seems to be feeling normal.

## 2015-10-04 NOTE — ED Notes (Signed)
Patient drinking ginger ale w/o nausea

## 2015-10-04 NOTE — ED Provider Notes (Signed)
CSN: [id number]     Arrival date & time 10/04/15  1011 History   First MD Initiated Contact with Patient 10/04/15 1017     Chief Complaint  Patient presents with  . Emesis  . Diarrhea   HPI Patient presents to the emergency room with complaints of vomiting and diarrhea that started yesterday evening. She's had multiple episodes of clear emesis and extremely watery diarrhea. Whenever she tries to eat or drink she have diarrhea. Patient had not been able to keep down any solids or liquids until this morning when she was able to keep down a little bit of Gatorade. She feels dehydrated. She denies any abdominal pain. No dysuria. No fevers or chills. She denies any recent antibiotics. She denies any travel out of the country. History reviewed. No pertinent past medical history. Past Surgical History  Procedure Laterality Date  . Skin biopsy    . Cystectomy     No family history on file. Social History  Substance Use Topics  . Smoking status: Light Tobacco Smoker    Types: Cigarettes  . Smokeless tobacco: Never Used  . Alcohol Use: Yes     Comment: 4 drinks/week   OB History    No data available     Review of Systems  All other systems reviewed and are negative.     Allergies  Naproxen  Home Medications   Prior to Admission medications   Medication Sig Start Date End Date Taking? Authorizing Provider  acetaminophen-codeine (TYLENOL #3) 300-30 MG per tablet Take 1-2 tablets by mouth every 6 (six) hours as needed for moderate pain. 12/19/13   Toy CookeyMegan Docherty, MD  amoxicillin (AMOXIL) 500 MG capsule Take 2 capsules (1,000 mg total) by mouth 2 (two) times daily. 12/19/13   Toy CookeyMegan Docherty, MD  amoxicillin (AMOXIL) 500 MG capsule Take 1 capsule (500 mg total) by mouth 3 (three) times daily. 01/29/15   Tiffany Neva SeatGreene, PA-C  azithromycin (ZITHROMAX) 250 MG tablet Take 1 tablet (250 mg total) by mouth daily. Take first 2 tablets together, then 1 every day until finished. 09/25/12   Elson AreasLeslie K  Sofia, PA-C  benzonatate (TESSALON) 100 MG capsule Take 1 capsule (100 mg total) by mouth every 8 (eight) hours. 09/25/12   Elson AreasLeslie K Sofia, PA-C  cephALEXin (KEFLEX) 500 MG capsule Take 1 capsule (500 mg total) by mouth 4 (four) times daily. 12/24/14   Kathrynn Speedobyn M Hess, PA-C  ciprofloxacin (CIPRO) 250 MG tablet Take 1 tablet (250 mg total) by mouth every 12 (twelve) hours. 01/28/14   Purvis SheffieldForrest Harrison, MD  ibuprofen (ADVIL,MOTRIN) 200 MG tablet Take 400 mg by mouth every 6 (six) hours as needed. For cramps.    Historical Provider, MD  ibuprofen (ADVIL,MOTRIN) 600 MG tablet Take 1 tablet (600 mg total) by mouth every 6 (six) hours as needed. 10/07/13   Jerelyn ScottMartha Linker, MD  metroNIDAZOLE (FLAGYL) 500 MG tablet Take 1 tablet (500 mg total) by mouth 2 (two) times daily. One po bid x 7 days 12/24/14   Kathrynn Speedobyn M Hess, PA-C  ondansetron (ZOFRAN ODT) 8 MG disintegrating tablet Take 1 tablet (8 mg total) by mouth every 8 (eight) hours as needed for nausea or vomiting. 10/04/15   Linwood DibblesJon Anise Harbin, MD  traMADol (ULTRAM) 50 MG tablet Take 1 tablet (50 mg total) by mouth every 6 (six) hours as needed. 01/29/15   Tiffany Neva SeatGreene, PA-C   BP 110/78 mmHg  Pulse 70  Temp(Src) 98 F (36.7 C) (Oral)  Resp 18  Ht 5\' 4"  (  1.626 m)  Wt 104.327 kg  BMI 39.46 kg/m2  SpO2 100% Physical Exam  Constitutional: She appears well-developed and well-nourished. No distress.  HENT:  Head: Normocephalic and atraumatic.  Right Ear: External ear normal.  Left Ear: External ear normal.  Eyes: Conjunctivae are normal. Right eye exhibits no discharge. Left eye exhibits no discharge. No scleral icterus.  Neck: Neck supple. No tracheal deviation present.  Cardiovascular: Normal rate, regular rhythm and intact distal pulses.   Pulmonary/Chest: Effort normal and breath sounds normal. No stridor. No respiratory distress. She has no wheezes. She has no rales.  Abdominal: Soft. Bowel sounds are normal. She exhibits no distension. There is no tenderness. There  is no rebound and no guarding.  Musculoskeletal: She exhibits no edema or tenderness.  Neurological: She is alert. She has normal strength. No cranial nerve deficit (no facial droop, extraocular movements intact, no slurred speech) or sensory deficit. She exhibits normal muscle tone. She displays no seizure activity. Coordination normal.  Skin: Skin is warm and dry. No rash noted.  Psychiatric: She has a normal mood and affect.  Nursing note and vitals reviewed.   ED Course  Procedures (including critical care time) Labs Review Labs Reviewed  URINALYSIS, ROUTINE W REFLEX MICROSCOPIC (NOT AT [facility]) - Abnormal; Notable for the following:    Color, Urine AMBER (*)    APPearance CLOUDY (*)    Specific Gravity, Urine 1.031 (*)    All other components within normal limits  COMPREHENSIVE METABOLIC PANEL - Abnormal; Notable for the following:    Glucose, Bld 101 (*)    Creatinine, Ser 1.13 (*)    Total Protein 8.2 (*)    All other components within normal limits  PREGNANCY, URINE  CBC WITH DIFFERENTIAL/PLATELET   Medications  sodium chloride 0.9 % bolus 1,000 mL (0 mLs Intravenous Stopped 10/04/15 1146)    And  0.9 %  sodium chloride infusion (not administered)  ondansetron (ZOFRAN) injection 4 mg (4 mg Intravenous Given 10/04/15 1100)     MDM   Final diagnoses:  Gastroenteritis    Patient's symptoms improved with IV fluids and antibiotics. She's had no further episodes of vomiting. Suspect her symptoms are related to a viral gastroenteritis.  I doubt appendicitis, colitis, cholecystitis  or other serious causes of vomiting and diarrhea.  Linwood Dibbles, MD 10/04/15 [phone]

## 2015-10-28 ENCOUNTER — Encounter (HOSPITAL_BASED_OUTPATIENT_CLINIC_OR_DEPARTMENT_OTHER): Payer: Self-pay

## 2015-10-28 ENCOUNTER — Emergency Department (HOSPITAL_BASED_OUTPATIENT_CLINIC_OR_DEPARTMENT_OTHER)
Admission: EM | Admit: 2015-10-28 | Discharge: 2015-10-28 | Disposition: A | Discharge status: Home / Self Care | Payer: Self-pay | Attending: Emergency Medicine | Admitting: Emergency Medicine

## 2015-10-28 DIAGNOSIS — F17210 Nicotine dependence, cigarettes, uncomplicated: Secondary | ICD-10-CM | POA: Insufficient documentation

## 2015-10-28 DIAGNOSIS — J029 Acute pharyngitis, unspecified: Secondary | ICD-10-CM

## 2015-10-28 DIAGNOSIS — J069 Acute upper respiratory infection, unspecified: Secondary | ICD-10-CM | POA: Insufficient documentation

## 2015-10-28 HISTORY — DX: Bronchitis, not specified as acute or chronic: J40

## 2015-10-28 LAB — RAPID STREP SCREEN (MED CTR MEBANE ONLY): Streptococcus, Group A Screen (Direct): NEGATIVE

## 2015-10-28 MED ORDER — GUAIFENESIN ER 1200 MG PO TB12: 1.0000 | ORAL_TABLET | Freq: Two times a day (BID) | ORAL | Status: DC

## 2015-10-28 MED ORDER — PREDNISONE 50 MG PO TABS: 50.0000 mg | ORAL_TABLET | Freq: Every day | ORAL | Status: DC

## 2015-10-28 MED ORDER — ACETAMINOPHEN-CODEINE 120-12 MG/5ML PO SOLN: 10.0000 mL | ORAL | Status: DC | PRN

## 2015-10-28 MED FILL — MUCINEX ER 1,200 MG TABLET: 1200 | 14 days supply | Qty: 28 | Fill #0

## 2015-10-28 MED FILL — predniSONE 50 MG TABS: 50 | 5 days supply | Qty: 5 | Fill #0

## 2015-10-28 MED FILL — ACETAMINOPHEN-CODEINE ELX: 120-12 | 2 days supply | Qty: 120 | Fill #0

## 2015-10-28 NOTE — Discharge Instructions (Signed)
Return here as needed.  Follow-up with the primary care doctor °

## 2015-10-28 NOTE — ED Provider Notes (Signed)
CSN: [id number]     Arrival date & time 10/28/15  1213 History   First MD Initiated Contact with Patient 10/28/15 1224     Chief Complaint  Patient presents with  . Sore Throat     HPI The patient is a 30 year old black female no PMH who presents today with sore throat x1 day. She states her throat soreness started last night, she noticed trouble swallowing and tender lymph nodes. Of note, she has a history of seasonal allergic rhinitis and endorses watery eyes and nasal congestion for the past few weeks. Today she endorses right ear fullness, throat pain, post-nasal drip with clear mucus. Denies chest cough, fever, chills, sweating, abdominal pain or palpitations.    Past Medical History  Diagnosis Date  . Bronchitis    Past Surgical History  Procedure Laterality Date  . Skin biopsy    . Cystectomy     History reviewed. No pertinent family history. Social History  Substance Use Topics  . Smoking status: Light Tobacco Smoker    Types: Cigarettes  . Smokeless tobacco: Never Used  . Alcohol Use: Yes     Comment: 4 drinks/week   OB History    No data available     Review of Systems The pertinent ROS as above in HPI   Allergies  Naproxen  Home Medications   Prior to Admission medications   Medication Sig Start Date End Date Taking? Authorizing Provider  acetaminophen-codeine (TYLENOL #3) 300-30 MG per tablet Take 1-2 tablets by mouth every 6 (six) hours as needed for moderate pain. 12/19/13   Toy Cookey, MD  amoxicillin (AMOXIL) 500 MG capsule Take 2 capsules (1,000 mg total) by mouth 2 (two) times daily. 12/19/13   Toy Cookey, MD  amoxicillin (AMOXIL) 500 MG capsule Take 1 capsule (500 mg total) by mouth 3 (three) times daily. 01/29/15   Tiffany Neva Seat, PA-C  azithromycin (ZITHROMAX) 250 MG tablet Take 1 tablet (250 mg total) by mouth daily. Take first 2 tablets together, then 1 every day until finished. 09/25/12   Elson Areas, PA-C  benzonatate (TESSALON) 100 MG  capsule Take 1 capsule (100 mg total) by mouth every 8 (eight) hours. 09/25/12   Elson Areas, PA-C  cephALEXin (KEFLEX) 500 MG capsule Take 1 capsule (500 mg total) by mouth 4 (four) times daily. 12/24/14   Kathrynn Speed, PA-C  ciprofloxacin (CIPRO) 250 MG tablet Take 1 tablet (250 mg total) by mouth every 12 (twelve) hours. 01/28/14   Purvis Sheffield, MD  ibuprofen (ADVIL,MOTRIN) 200 MG tablet Take 400 mg by mouth every 6 (six) hours as needed. For cramps.    Historical Provider, MD  ibuprofen (ADVIL,MOTRIN) 600 MG tablet Take 1 tablet (600 mg total) by mouth every 6 (six) hours as needed. 10/07/13   Jerelyn Scott, MD  metroNIDAZOLE (FLAGYL) 500 MG tablet Take 1 tablet (500 mg total) by mouth 2 (two) times daily. One po bid x 7 days 12/24/14   Kathrynn Speed, PA-C  ondansetron (ZOFRAN ODT) 8 MG disintegrating tablet Take 1 tablet (8 mg total) by mouth every 8 (eight) hours as needed for nausea or vomiting. 10/04/15   Linwood Dibbles, MD  traMADol (ULTRAM) 50 MG tablet Take 1 tablet (50 mg total) by mouth every 6 (six) hours as needed. 01/29/15   Tiffany Neva Seat, PA-C   BP 128/72 mmHg  Pulse 61  Temp(Src) 99 F (37.2 C) (Oral)  Resp 15  Ht  (1.626 m)  Wt 104.327  kg  BMI 39.46 kg/m2  SpO2 100%  LMP 10/27/2015 Physical Exam  Constitutional: She is oriented to person, place, and time. She appears well-developed and well-nourished.  HENT:  Head: Normocephalic and atraumatic.  Right ear TM not able to be visualized, left TM clear and non-erythematous.  Erythematous posterior oropharynx, tonsillar adenopathy more prominent on the right tonsil.  No exudates, no petechiae on hard palate.   Eyes: Conjunctivae and EOM are normal. Pupils are equal, round, and reactive to light.  Neck: Normal range of motion. Neck supple.  Cardiovascular: Normal rate, regular rhythm, normal heart sounds and intact distal pulses.  Exam reveals no gallop and no friction rub.   No murmur heard. Pulmonary/Chest: Effort normal  and breath sounds normal. No respiratory distress. She has no wheezes. She has no rales.  Musculoskeletal: She exhibits no edema.  Lymphadenopathy:    She has cervical adenopathy.  Neurological: She is alert and oriented to person, place, and time. She exhibits normal muscle tone. Coordination normal.  Skin: Skin is warm. No rash noted. No erythema.  Nursing note and vitals reviewed.   ED Course  Procedures (including critical care time) Labs Review Labs Reviewed  RAPID STREP SCREEN (NOT AT [facility])    Imaging Review No results found. I have personally reviewed and evaluated these images and lab results as part of my medical decision-making.   MDM   The patient presents with 1 day of sore throat, post-nasal drip, Cough and ear fullness. Rapid strep negative, will send throat culture. The patient likely has a viral URI including pharyngitis.  Will treat the patient symptomatically and encourage follow up if symptoms do not resolve The patient is advised that she will be treated for an upper respiratory infection.  Told to return here as needed.  Patient agrees the plan and all questions were answered   Charlestine NightChristopher Stephannie Broner, PA-C 10/28/15 1650  Leta BaptistEmily Roe Nguyen, MD 10/29/15 0700

## 2015-10-28 NOTE — ED Notes (Signed)
Pt reports right ear pain, sore throat, onset yesterday. Significant swelling to left tonsil. No airway or respiratory distress.

## 2015-10-30 LAB — CULTURE, GROUP A STREP (THRC)

## 2015-12-16 ENCOUNTER — Emergency Department (HOSPITAL_BASED_OUTPATIENT_CLINIC_OR_DEPARTMENT_OTHER): Payer: No Typology Code available for payment source

## 2015-12-16 ENCOUNTER — Encounter (HOSPITAL_BASED_OUTPATIENT_CLINIC_OR_DEPARTMENT_OTHER): Payer: Self-pay | Admitting: *Deleted

## 2015-12-16 ENCOUNTER — Emergency Department (HOSPITAL_BASED_OUTPATIENT_CLINIC_OR_DEPARTMENT_OTHER)
Admission: EM | Admit: 2015-12-16 | Discharge: 2015-12-16 | Disposition: A | Discharge status: Home / Self Care | Payer: No Typology Code available for payment source | Attending: Emergency Medicine | Admitting: Emergency Medicine

## 2015-12-16 DIAGNOSIS — Y999 Unspecified external cause status: Secondary | ICD-10-CM | POA: Insufficient documentation

## 2015-12-16 DIAGNOSIS — Y939 Activity, unspecified: Secondary | ICD-10-CM | POA: Insufficient documentation

## 2015-12-16 DIAGNOSIS — Y92410 Unspecified street and highway as the place of occurrence of the external cause: Secondary | ICD-10-CM | POA: Diagnosis not present

## 2015-12-16 DIAGNOSIS — M2550 Pain in unspecified joint: Secondary | ICD-10-CM | POA: Insufficient documentation

## 2015-12-16 DIAGNOSIS — F17210 Nicotine dependence, cigarettes, uncomplicated: Secondary | ICD-10-CM | POA: Insufficient documentation

## 2015-12-16 DIAGNOSIS — M62838 Other muscle spasm: Secondary | ICD-10-CM | POA: Insufficient documentation

## 2015-12-16 DIAGNOSIS — M25512 Pain in left shoulder: Secondary | ICD-10-CM | POA: Diagnosis present

## 2015-12-16 MED ORDER — IBUPROFEN 600 MG PO TABS: 600.0000 mg | ORAL_TABLET | Freq: Four times a day (QID) | ORAL | Status: DC | PRN

## 2015-12-16 MED ORDER — DIAZEPAM 5 MG PO TABS: 5.0000 mg | ORAL_TABLET | Freq: Four times a day (QID) | ORAL | Status: DC | PRN

## 2015-12-16 MED ORDER — DIAZEPAM 5 MG PO TABS
5.0000 mg | ORAL_TABLET | Freq: Once | ORAL | Status: AC
  Administered 2015-12-16: 5 mg via ORAL
  Filled 2015-12-16: qty 1

## 2015-12-16 MED ORDER — IBUPROFEN 800 MG PO TABS
800.0000 mg | ORAL_TABLET | Freq: Once | ORAL | Status: AC
  Administered 2015-12-16: 800 mg via ORAL
  Filled 2015-12-16: qty 1
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
## 2015-12-16 NOTE — ED Notes (Signed)
Pt given d/c instructions as per chart. Verbalizes understanding. No questions. Rx x 2. 
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
## 2016-05-22 ENCOUNTER — Encounter (HOSPITAL_BASED_OUTPATIENT_CLINIC_OR_DEPARTMENT_OTHER): Payer: Self-pay | Admitting: Emergency Medicine

## 2016-05-22 ENCOUNTER — Emergency Department (HOSPITAL_BASED_OUTPATIENT_CLINIC_OR_DEPARTMENT_OTHER)
Admission: EM | Admit: 2016-05-22 | Discharge: 2016-05-23 | Disposition: A | Discharge status: Home / Self Care | Payer: Self-pay | Attending: Emergency Medicine | Admitting: Emergency Medicine

## 2016-05-22 DIAGNOSIS — B9689 Other specified bacterial agents as the cause of diseases classified elsewhere: Secondary | ICD-10-CM

## 2016-05-22 DIAGNOSIS — L0889 Other specified local infections of the skin and subcutaneous tissue: Secondary | ICD-10-CM | POA: Insufficient documentation

## 2016-05-22 DIAGNOSIS — F17210 Nicotine dependence, cigarettes, uncomplicated: Secondary | ICD-10-CM | POA: Insufficient documentation

## 2016-05-22 DIAGNOSIS — L089 Local infection of the skin and subcutaneous tissue, unspecified: Secondary | ICD-10-CM

## 2016-05-22 LAB — PREGNANCY, URINE: Preg Test, Ur: NEGATIVE

## 2016-05-22 NOTE — ED Provider Notes (Signed)
[facility] Note   CSN: [id number] Arrival date & time: 05/22/16  2241  By signing my name below, I, Bridget Griffin, attest that this documentation has been prepared under the direction and in the presence of Fayrene Helper, PA-C.  Electronically Signed: Rosario Griffin, ED Scribe. 05/22/16. 11:13 PM.  History   Chief Complaint Chief Complaint  Patient presents with  . Abscess   The history is provided by the patient. No language interpreter was used.    HPI Comments: Bridget Griffin is a 30 y.o. female with no pertinent PMHx, who presents to the Emergency Department complaining of multiple moderate, gradually worsening areas of pain and swelling to the abdomen, left thigh, and right arm onset approximately 1 week ago. She notes that initially her areas began on her abdomen and has been spreading since. Pt states pain is exacerbated with palpation and direct pressure. She has been soaking the areas in hot water with minimal relief. No recent changes in her daily routine to precipitate this issue. No new soaps, lotions, detergents, foods, animals, plants, or medications otherwise. She is unsure if she could currently be pregnant. Denies fever, chills, drainage from the areas, or any other associated symptoms. Tetanus is not UTD.   Past Medical History:  Diagnosis Date  . Bronchitis    There are no active problems to display for this patient.  Past Surgical History:  Procedure Laterality Date  . CYSTECTOMY    . SKIN BIOPSY     OB History    No data available     Home Medications    Prior to Admission medications   Medication Sig Start Date End Date Taking? Authorizing Provider  acetaminophen-codeine 120-12 MG/5ML solution Take 10 mLs by mouth every 4 (four) hours as needed for moderate pain. 10/28/15   Charlestine Night, PA-C  amoxicillin (AMOXIL) 500 MG capsule Take 2 capsules (1,000 mg total) by mouth 2 (two) times daily. 12/19/13   Toy Cookey,  MD  amoxicillin (AMOXIL) 500 MG capsule Take 1 capsule (500 mg total) by mouth 3 (three) times daily. 01/29/15   Tiffany Neva Seat, PA-C  azithromycin (ZITHROMAX) 250 MG tablet Take 1 tablet (250 mg total) by mouth daily. Take first 2 tablets together, then 1 every day until finished. 09/25/12   Elson Areas, PA-C  benzonatate (TESSALON) 100 MG capsule Take 1 capsule (100 mg total) by mouth every 8 (eight) hours. 09/25/12   Elson Areas, PA-C  cephALEXin (KEFLEX) 500 MG capsule Take 1 capsule (500 mg total) by mouth 4 (four) times daily. 12/24/14   Kathrynn Speed, PA-C  ciprofloxacin (CIPRO) 250 MG tablet Take 1 tablet (250 mg total) by mouth every 12 (twelve) hours. 01/28/14   Purvis Sheffield, MD  diazepam (VALIUM) 5 MG tablet Take 1 tablet (5 mg total) by mouth every 6 (six) hours as needed for muscle spasms. 12/16/15   Leta Baptist, MD  Guaifenesin 1200 MG TB12 Take 1 tablet (1,200 mg total) by mouth 2 (two) times daily. 10/28/15   Charlestine Night, PA-C  ibuprofen (ADVIL,MOTRIN) 600 MG tablet Take 1 tablet (600 mg total) by mouth every 6 (six) hours as needed for moderate pain. 12/16/15   Leta Baptist, MD  metroNIDAZOLE (FLAGYL) 500 MG tablet Take 1 tablet (500 mg total) by mouth 2 (two) times daily. One po bid x 7 days 12/24/14   Kathrynn Speed, PA-C  ondansetron (ZOFRAN ODT) 8 MG disintegrating tablet Take 1 tablet (8 mg total)  by mouth every 8 (eight) hours as needed for nausea or vomiting. 10/04/15   Linwood DibblesJon Knapp, MD  predniSONE (DELTASONE) 50 MG tablet Take 1 tablet (50 mg total) by mouth daily with breakfast. 10/28/15   Charlestine Nighthristopher Lawyer, PA-C  traMADol (ULTRAM) 50 MG tablet Take 1 tablet (50 mg total) by mouth every 6 (six) hours as needed. 01/29/15   Marlon Peliffany Greene, PA-C   Family History History reviewed. No pertinent family history.  Social History Social History  Substance Use Topics  . Smoking status: Light Tobacco Smoker    Types: Cigarettes  . Smokeless tobacco: Never Used  . Alcohol  use Yes     Comment: 4 drinks/week   Allergies   Naproxen  Review of Systems Review of Systems  Constitutional: Negative for chills and fever.  Musculoskeletal: Positive for myalgias.   Physical Exam Updated Vital Signs BP 138/76 (BP Location: Right Arm)   Pulse 88   Temp 98.9 F (37.2 C) (Oral)   Resp 18   Ht 5\' 4"  (1.626 m)   Wt 213 lb (96.6 kg)   SpO2 100%   BMI 36.56 kg/m   Physical Exam  Constitutional: She appears well-developed and well-nourished. No distress.  HENT:  Head: Normocephalic and atraumatic.  Eyes: Conjunctivae are normal.  Neck: Normal range of motion.  Cardiovascular: Normal rate.   Pulmonary/Chest: Effort normal.  Abdominal: She exhibits no distension.  Musculoskeletal: Normal range of motion.  Neurological: She is alert.  Skin: No erythema. No pallor.  Multiple acne like lesions noted to the pannus, left upper thigh, right upper abdomen, right upper arm, and right axillary region. Mildly TTP without any significant erythema. Each lesion is less than one centimeter in size.   Psychiatric: She has a normal mood and affect. Her behavior is normal.  Nursing note and vitals reviewed.  ED Treatments / Results  DIAGNOSTIC STUDIES: Oxygen Saturation is 100% on RA, normal by my interpretation.   COORDINATION OF CARE: 11:11 PM-Discussed next steps with pt. Pt verbalized understanding and is agreeable with the plan.   Procedures Procedures   Medications Ordered in ED Medications - No data to display  Initial Impression / Assessment and Plan / ED Course  I have reviewed the triage vital signs and the nursing notes.  Pertinent labs & imaging results that were available during my care of the patient were reviewed by me and considered in my medical decision making (see chart for details).  Clinical Course    Pt is a 30yo female who presents to the ED with multiple abscesses to the right arm, left thigh, and abdomen. All <1cm in size and otherwise  not warranting draining at this time. Will d/c on Doxycycline and advised to f/u if symptoms do not improve or seem to worsen in any way. Pt is comfortable with above plan and is stable for discharge at this time. All questions were answered prior to disposition. Strict return precautions for return into the ED were discussed.   Final Clinical Impressions(s) / ED Diagnoses   Final diagnoses:  Bacterial skin infection   New Prescriptions New Prescriptions   DOXYCYCLINE (VIBRAMYCIN) 100 MG CAPSULE    Take 1 capsule (100 mg total) by mouth 2 (two) times daily. One po bid x 7 days   I personally performed the services described in this documentation, which was scribed in my presence. The recorded information has been reviewed and is accurate.       Fayrene HelperBowie Alesa Echevarria, PA-C 05/23/16 0006  Alvira MondayErin Schlossman, MD 05/23/16 2130

## 2016-05-22 NOTE — ED Triage Notes (Signed)
Patient reports multiple abscesses which began about 1 week ago.  States these have been under her arms, on her bikini area and now on her stomach.  Reports drainage.

## 2016-05-23 MED ORDER — DOXYCYCLINE HYCLATE 100 MG PO CAPS
100.0000 mg | ORAL_CAPSULE | Freq: Two times a day (BID) | ORAL | 0 refills | Status: DC
Start: 2016-05-23 — End: 2019-06-04

## 2016-07-02 ENCOUNTER — Emergency Department (HOSPITAL_BASED_OUTPATIENT_CLINIC_OR_DEPARTMENT_OTHER)
Admission: EM | Admit: 2016-07-02 | Discharge: 2016-07-02 | Disposition: A | Discharge status: Home / Self Care | Payer: Self-pay | Attending: Emergency Medicine | Admitting: Emergency Medicine

## 2016-07-02 ENCOUNTER — Encounter (HOSPITAL_BASED_OUTPATIENT_CLINIC_OR_DEPARTMENT_OTHER): Payer: Self-pay | Admitting: Emergency Medicine

## 2016-07-02 DIAGNOSIS — B9789 Other viral agents as the cause of diseases classified elsewhere: Secondary | ICD-10-CM

## 2016-07-02 DIAGNOSIS — J069 Acute upper respiratory infection, unspecified: Secondary | ICD-10-CM

## 2016-07-02 DIAGNOSIS — F17210 Nicotine dependence, cigarettes, uncomplicated: Secondary | ICD-10-CM | POA: Insufficient documentation

## 2016-07-02 DIAGNOSIS — J040 Acute laryngitis: Secondary | ICD-10-CM

## 2016-07-02 LAB — RAPID STREP SCREEN (MED CTR MEBANE ONLY): Streptococcus, Group A Screen (Direct): NEGATIVE

## 2016-07-02 MED ORDER — PREDNISONE 50 MG PO TABS: 50.0000 mg | ORAL_TABLET | Freq: Every day | ORAL | 0 refills | Status: DC

## 2016-07-02 MED ORDER — PROMETHAZINE-DM 6.25-15 MG/5ML PO SYRP: 5.0000 mL | ORAL_SOLUTION | Freq: Four times a day (QID) | ORAL | 0 refills | Status: DC | PRN

## 2016-07-02 MED ORDER — GUAIFENESIN ER 1200 MG PO TB12: 1.0000 | ORAL_TABLET | Freq: Two times a day (BID) | ORAL | 0 refills | Status: DC

## 2016-07-02 NOTE — ED Provider Notes (Signed)
[facility]   CSN: [id number] Arrival date & time: 07/02/16  1516 By signing my name below, I, Levon HedgerElizabeth Hall, attest that this documentation has been prepared under the direction and in the presence of non-physician practitioner, Ebbie Ridgehris Arjan Strohm, PA-C. Electronically Signed: Levon HedgerElizabeth Hall, Scribe. 07/02/2016. 5:44 PM.   History   Chief Complaint Chief Complaint  Patient presents with  . Cough    HPI Bridget Griffin is a 30 y.o. female with a hx of bronchitis who presents to the Emergency Department complaining of intermittent, worsening cough productive of yellow sputum onset a few days ago. She notes associated voice hoarseness which began last night, sore throat, and left ear pain. Sore throat is exacerbated by cough and by speaking. No alleviating factors noted. No treatments tried PTA. She denies any generalized body aches, nausea, or vomiting.   The history is provided by the patient. No language interpreter was used.    Past Medical History:  Diagnosis Date  . Bronchitis    There are no active problems to display for this patient.   Past Surgical History:  Procedure Laterality Date  . CYSTECTOMY    . SKIN BIOPSY      OB History    No data available      Home Medications    Prior to Admission medications   Medication Sig Start Date End Date Taking? Authorizing Provider  acetaminophen-codeine 120-12 MG/5ML solution Take 10 mLs by mouth every 4 (four) hours as needed for moderate pain. 10/28/15   Charlestine Nighthristopher Dezi Schaner, PA-C  azithromycin (ZITHROMAX) 250 MG tablet Take 1 tablet (250 mg total) by mouth daily. Take first 2 tablets together, then 1 every day until finished. 09/25/12   Elson AreasLeslie K Sofia, PA-C  benzonatate (TESSALON) 100 MG capsule Take 1 capsule (100 mg total) by mouth every 8 (eight) hours. 09/25/12   Elson AreasLeslie K Sofia, PA-C  cephALEXin (KEFLEX) 500 MG capsule Take 1 capsule (500 mg total) by mouth 4 (four) times daily. 12/24/14   Kathrynn Speedobyn M  Hess, PA-C  ciprofloxacin (CIPRO) 250 MG tablet Take 1 tablet (250 mg total) by mouth every 12 (twelve) hours. 01/28/14   Purvis SheffieldForrest Harrison, MD  diazepam (VALIUM) 5 MG tablet Take 1 tablet (5 mg total) by mouth every 6 (six) hours as needed for muscle spasms. 12/16/15   Leta BaptistEmily Roe Nguyen, MD  doxycycline (VIBRAMYCIN) 100 MG capsule Take 1 capsule (100 mg total) by mouth 2 (two) times daily. One po bid x 7 days 05/23/16   Fayrene HelperBowie Tran, PA-C  Guaifenesin 1200 MG TB12 Take 1 tablet (1,200 mg total) by mouth 2 (two) times daily. 10/28/15   Charlestine Nighthristopher Jagger Demonte, PA-C  ibuprofen (ADVIL,MOTRIN) 600 MG tablet Take 1 tablet (600 mg total) by mouth every 6 (six) hours as needed for moderate pain. 12/16/15   Leta BaptistEmily Roe Nguyen, MD  metroNIDAZOLE (FLAGYL) 500 MG tablet Take 1 tablet (500 mg total) by mouth 2 (two) times daily. One po bid x 7 days 12/24/14   Kathrynn Speedobyn M Hess, PA-C  ondansetron (ZOFRAN ODT) 8 MG disintegrating tablet Take 1 tablet (8 mg total) by mouth every 8 (eight) hours as needed for nausea or vomiting. 10/04/15   Linwood DibblesJon Knapp, MD  predniSONE (DELTASONE) 50 MG tablet Take 1 tablet (50 mg total) by mouth daily with breakfast. 10/28/15   Charlestine Nighthristopher Kailey Esquilin, PA-C  traMADol (ULTRAM) 50 MG tablet Take 1 tablet (50 mg total) by mouth every 6 (six) hours as needed. 01/29/15   Marlon Peliffany Greene, PA-C  Family History No family history on file.  Social History Social History  Substance Use Topics  . Smoking status: Light Tobacco Smoker    Types: Cigarettes  . Smokeless tobacco: Never Used  . Alcohol use Yes     Comment: occasionaly     Allergies   Naproxen  Review of Systems Review of Systems 10 systems reviewed and all are negative for acute change except as noted in the HPI.   Physical Exam Updated Vital Signs BP 127/91 (BP Location: Right Arm)   Pulse 61   Temp 98.4 F (36.9 C) (Oral)   Resp 17   Ht 5\' 4"  (1.626 m)   Wt 213 lb (96.6 kg)   LMP 06/30/2016   SpO2 100%   BMI 36.56 kg/m   Physical  Exam  Constitutional: She is oriented to person, place, and time. She appears well-developed and well-nourished. No distress.  HENT:  Head: Normocephalic and atraumatic.  Bilateral tonsillar swelling  Eyes: Pupils are equal, round, and reactive to light.  Neck: Normal range of motion. Neck supple.  Cardiovascular: Normal rate, regular rhythm and normal heart sounds.  Exam reveals no friction rub.   No murmur heard. Pulmonary/Chest: Effort normal and breath sounds normal. No respiratory distress. She has no wheezes.  Neurological: She is alert and oriented to person, place, and time.  Skin: Skin is warm and dry. Capillary refill takes less than 2 seconds.  Psychiatric: She has a normal mood and affect.  Nursing note and vitals reviewed.  ED Treatments / Results  DIAGNOSTIC STUDIES:  Oxygen Saturation is 100% on RA, normal by my interpretation.    COORDINATION OF CARE:  5:45 PM Discussed treatment plan with pt at bedside and pt agreed to plan.   Labs (all labs ordered are listed, but only abnormal results are displayed) Labs Reviewed  RAPID STREP SCREEN (NOT AT [facility])  CULTURE, GROUP A STREP [facility]    EKG  EKG Interpretation None       Radiology No results found.  Procedures Procedures (including critical care time)  Medications Ordered in ED Medications - No data to display   Initial Impression / Assessment and Plan / ED Course  I have reviewed the triage vital signs and the nursing notes.  Pertinent labs & imaging results that were available during my care of the patient were reviewed by me and considered in my medical decision making (see chart for details).  Clinical Course     The patient will be treated for a viral URI.  Told to return here as needed.  Patient is advised to rest as much as possible and increase her fluid intake.  All questions were answered  Final Clinical Impressions(s) / ED Diagnoses   Final diagnoses:  None    New  Prescriptions New Prescriptions   No medications on file  I personally performed the services described in this documentation, which was scribed in my presence. The recorded information has been reviewed and is accurate.   Charlestine NightChristopher Kanae Ignatowski, PA-C 07/02/16 1756    Loren Raceravid Yelverton, MD 07/03/16 [id number]

## 2016-07-02 NOTE — ED Triage Notes (Signed)
Cough, sore throat and left ear pain and hoarseness for the past couple days.

## 2016-07-02 NOTE — ED Notes (Signed)
Pt states she has had ear pain, head congestion and hoarseness since yesterday. Prod cough with yellow sputum. Denies other s/s.

## 2016-07-02 NOTE — Discharge Instructions (Signed)
You have a viral upper respiratory illness.  Return here as needed.  Increase your fluid intake, rest as much as possible

## 2016-07-05 LAB — CULTURE, GROUP A STREP (THRC)

## 2016-09-07 ENCOUNTER — Emergency Department (HOSPITAL_BASED_OUTPATIENT_CLINIC_OR_DEPARTMENT_OTHER)
Admission: EM | Admit: 2016-09-07 | Discharge: 2016-09-07 | Disposition: A | Discharge status: Home / Self Care | Payer: Self-pay | Attending: Physician Assistant | Admitting: Physician Assistant

## 2016-09-07 ENCOUNTER — Encounter (HOSPITAL_BASED_OUTPATIENT_CLINIC_OR_DEPARTMENT_OTHER): Payer: Self-pay

## 2016-09-07 DIAGNOSIS — Z791 Long term (current) use of non-steroidal anti-inflammatories (NSAID): Secondary | ICD-10-CM | POA: Insufficient documentation

## 2016-09-07 DIAGNOSIS — T7840XA Allergy, unspecified, initial encounter: Secondary | ICD-10-CM | POA: Insufficient documentation

## 2016-09-07 DIAGNOSIS — F17210 Nicotine dependence, cigarettes, uncomplicated: Secondary | ICD-10-CM | POA: Insufficient documentation

## 2016-09-07 MED ORDER — DIPHENHYDRAMINE HCL 25 MG PO CAPS
50.0000 mg | ORAL_CAPSULE | Freq: Once | ORAL | Status: AC
  Administered 2016-09-07: 50 mg via ORAL
  Filled 2016-09-07: qty 2

## 2016-09-07 MED ORDER — EPINEPHRINE 0.3 MG/0.3ML IJ SOAJ
0.3000 mg | Freq: Once | INTRAMUSCULAR | 0 refills | Status: AC
Start: 2016-09-07 — End: 2016-09-07
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
## 2016-09-07 NOTE — ED Notes (Signed)
Pt denies ShOB or swelling when hives started to appear.

## 2016-09-07 NOTE — ED Provider Notes (Signed)
[facility] Provider Note   CSN: [id number] Arrival date & time: 09/07/16  1324  By signing my name below, I, Modena Jansky, attest that this documentation has been prepared under the direction and in the presence of Courteney Randall An, MD. Electronically Signed: Modena Jansky, Scribe. 09/07/2016. 7:40 PM.  History   Chief Complaint Chief Complaint  Patient presents with  . Allergic Reaction   The history is provided by the patient. No language interpreter was used.   HPI Comments: Bridget Griffin is a 31 y.o. female who presents to the Emergency Department complaining of a constant moderate neck/chest rash that started about 1.5 hours ago. She states she had a sudden onset of hives after eating jalapenos. She reports no known allergy to peppers or other foods. She describes the rash as itching and dry. She denies any other complaints.   Past Medical History:  Diagnosis Date  . Bronchitis     There are no active problems to display for this patient.   Past Surgical History:  Procedure Laterality Date  . CYSTECTOMY    . SKIN BIOPSY      OB History    No data available       Home Medications    Prior to Admission medications   Medication Sig Start Date End Date Taking? Authorizing Provider  acetaminophen-codeine 120-12 MG/5ML solution Take 10 mLs by mouth every 4 (four) hours as needed for moderate pain. 10/28/15   Charlestine Night, PA-C  azithromycin (ZITHROMAX) 250 MG tablet Take 1 tablet (250 mg total) by mouth daily. Take first 2 tablets together, then 1 every day until finished. 09/25/12   Elson Areas, PA-C  benzonatate (TESSALON) 100 MG capsule Take 1 capsule (100 mg total) by mouth every 8 (eight) hours. 09/25/12   Elson Areas, PA-C  cephALEXin (KEFLEX) 500 MG capsule Take 1 capsule (500 mg total) by mouth 4 (four) times daily. 12/24/14   Kathrynn Speed, PA-C  ciprofloxacin (CIPRO) 250 MG tablet Take 1 tablet (250 mg total) by mouth every 12 (twelve)  hours. 01/28/14   Purvis Sheffield, MD  diazepam (VALIUM) 5 MG tablet Take 1 tablet (5 mg total) by mouth every 6 (six) hours as needed for muscle spasms. 12/16/15   Leta Baptist, MD  doxycycline (VIBRAMYCIN) 100 MG capsule Take 1 capsule (100 mg total) by mouth 2 (two) times daily. One po bid x 7 days 05/23/16   Fayrene Helper, PA-C  Guaifenesin 1200 MG TB12 Take 1 tablet (1,200 mg total) by mouth 2 (two) times daily. 07/02/16   Charlestine Night, PA-C  ibuprofen (ADVIL,MOTRIN) 600 MG tablet Take 1 tablet (600 mg total) by mouth every 6 (six) hours as needed for moderate pain. 12/16/15   Leta Baptist, MD  metroNIDAZOLE (FLAGYL) 500 MG tablet Take 1 tablet (500 mg total) by mouth 2 (two) times daily. One po bid x 7 days 12/24/14   Kathrynn Speed, PA-C  ondansetron (ZOFRAN ODT) 8 MG disintegrating tablet Take 1 tablet (8 mg total) by mouth every 8 (eight) hours as needed for nausea or vomiting. 10/04/15   Linwood Dibbles, MD  predniSONE (DELTASONE) 50 MG tablet Take 1 tablet (50 mg total) by mouth daily with breakfast. 07/02/16   Charlestine Night, PA-C  promethazine-dextromethorphan (PROMETHAZINE-DM) 6.25-15 MG/5ML syrup Take 5 mLs by mouth 4 (four) times daily as needed for cough. 07/02/16   Charlestine Night, PA-C  traMADol (ULTRAM) 50 MG tablet Take 1 tablet (50 mg total) by mouth  every 6 (six) hours as needed. 01/29/15   Marlon Peliffany Greene, PA-C    Family History No family history on file.  Social History Social History  Substance Use Topics  . Smoking status: Current Every Day Smoker    Types: Cigarettes  . Smokeless tobacco: Never Used  . Alcohol use Yes     Comment: occasionaly      Allergies   Naproxen   Review of Systems Review of Systems  All other systems reviewed and are negative.    Physical Exam Updated Vital Signs BP 136/90 (BP Location: Left Arm)   Pulse 104   Temp 98.3 F (36.8 C) (Oral)   Resp 18   Ht 5\' 4"  (1.626 m)   Wt 230 lb (104.3 kg)   LMP 09/06/2016   SpO2  100%   BMI 39.48 kg/m   Physical Exam  Constitutional: She appears well-developed and well-nourished. No distress.  HENT:  Head: Normocephalic.  Eyes: Conjunctivae are normal.  Neck: Neck supple.  Cardiovascular: Normal rate and regular rhythm.   Pulmonary/Chest: Effort normal. No respiratory distress. She has no wheezes. She has no rales.  Abdominal: Soft.  Musculoskeletal: Normal range of motion.  Neurological: She is alert.  Skin: Skin is warm and dry.  No skin changes.   Psychiatric: She has a normal mood and affect.  Nursing note and vitals reviewed.    ED Treatments / Results  DIAGNOSTIC STUDIES: Oxygen Saturation is 100% on RA, normal by my interpretation.    COORDINATION OF CARE: 7:44 PM- Pt advised of plan for treatment and pt agrees.  Labs (all labs ordered are listed, but only abnormal results are displayed) Labs Reviewed - No data to display  EKG  EKG Interpretation None       Radiology No results found.  Procedures Procedures (including critical care time)  Medications Ordered in ED Medications  diphenhydrAMINE (BENADRYL) capsule 50 mg (50 mg Oral Given 09/07/16 1947)     Initial Impression / Assessment and Plan / ED Course  I have reviewed the triage vital signs and the nursing notes.  Pertinent labs & imaging results that were available during my care of the patient were reviewed by me and considered in my medical decision making (see chart for details).     I personally performed the services described in this documentation, which was scribed in my presence. The recorded information has been reviewed and is accurate.   Patient felt like she was having allergic reaction to some peppers that she touched today. Patient was working [employer] when she touched some peppers and felt like she had some skin reaction. On arrival here and did not see any reaction to her skin. We gave her Benadryl and observed for a short period of time. Patient still had  no reaction so we will discharge home with epinephrine pen and isntructions use Benadryl and return with any return of symptoms.  Final Clinical Impressions(s) / ED Diagnoses   Final diagnoses:  None    New Prescriptions New Prescriptions   No medications on file     Courteney Randall AnLyn Mackuen, MD 09/07/16 2120

## 2016-09-07 NOTE — ED Notes (Signed)
Warm blanket given to Pt. 

## 2016-09-07 NOTE — Discharge Instructions (Signed)
You were seen today with allergic reaction. Be sure to avoid anything that caused the reaction the first place. Please be sure to carry EpiPen with you.

## 2016-09-07 NOTE — ED Notes (Signed)
Pt states itching and hives are gone

## 2016-12-03 ENCOUNTER — Emergency Department (HOSPITAL_BASED_OUTPATIENT_CLINIC_OR_DEPARTMENT_OTHER): Payer: Self-pay

## 2016-12-03 ENCOUNTER — Encounter (HOSPITAL_BASED_OUTPATIENT_CLINIC_OR_DEPARTMENT_OTHER): Payer: Self-pay | Admitting: Emergency Medicine

## 2016-12-03 ENCOUNTER — Emergency Department (HOSPITAL_BASED_OUTPATIENT_CLINIC_OR_DEPARTMENT_OTHER)
Admission: EM | Admit: 2016-12-03 | Discharge: 2016-12-03 | Disposition: A | Discharge status: Home / Self Care | Payer: Self-pay | Attending: Emergency Medicine | Admitting: Emergency Medicine

## 2016-12-03 DIAGNOSIS — Z79899 Other long term (current) drug therapy: Secondary | ICD-10-CM | POA: Insufficient documentation

## 2016-12-03 DIAGNOSIS — R05 Cough: Secondary | ICD-10-CM | POA: Insufficient documentation

## 2016-12-03 DIAGNOSIS — R059 Cough, unspecified: Secondary | ICD-10-CM

## 2016-12-03 DIAGNOSIS — F17210 Nicotine dependence, cigarettes, uncomplicated: Secondary | ICD-10-CM | POA: Insufficient documentation

## 2016-12-03 DIAGNOSIS — R42 Dizziness and giddiness: Secondary | ICD-10-CM | POA: Insufficient documentation

## 2016-12-03 MED ORDER — MECLIZINE HCL 25 MG PO TABS: 25.0000 mg | ORAL_TABLET | Freq: Three times a day (TID) | ORAL | 0 refills | Status: DC | PRN

## 2016-12-03 MED ORDER — ALBUTEROL SULFATE HFA 108 (90 BASE) MCG/ACT IN AERS: 1.0000 | INHALATION_SPRAY | Freq: Four times a day (QID) | RESPIRATORY_TRACT | 0 refills | Status: DC | PRN

## 2016-12-03 NOTE — ED Triage Notes (Signed)
Patient states that she has had a cough and SOB x 2 -3 days. History of Bronchitis, course cough in triage

## 2016-12-03 NOTE — ED Provider Notes (Signed)
[facility]   CSN: [id number] Arrival date & time: 12/03/16  1642  By signing my name below, I, Bing Neighbors., attest that this documentation has been prepared under the direction and in the presence of Arby Barrette, MD. Electronically signed: Bing Neighbors., ED Scribe. 12/03/16. 6:12 PM.   History   Chief Complaint Chief Complaint  Patient presents with  . Cough    HPI Bridget Griffin is a 31 y.o. female with hx of bronchitis, vertigo who presents to the Emergency Department complaining of cough with onset x2 days. Pt states that x2 days ago she developed lightheadedness and dizziness that she thought was due to her hair being too tight. Pt states that if she moves or stands too quickly, she becomes dizzy and the room begins to spin. Pt reports lightheadedness, wheezing, SOB, non-productive cough, nausea. Pt also reports substernal chest pain that she describes as sharp which onset prior to the cough. Pt denies any modifying factors. She denies congestion, ear pain, sore throat, leg pain/swelling, vomiting. Of note, pt denies any recent long travel, recent surgical hx, hx of DVT. Pt is a smoker. Pt does not have a PCP. No Use of oral contraceptives.  The history is provided by the patient. No language interpreter was used.    Past Medical History:  Diagnosis Date  . Bronchitis     There are no active problems to display for this patient.   Past Surgical History:  Procedure Laterality Date  . CYSTECTOMY    . SKIN BIOPSY      OB History    No data available       Home Medications    Prior to Admission medications   Medication Sig Start Date End Date Taking? Authorizing Provider  acetaminophen-codeine 120-12 MG/5ML solution Take 10 mLs by mouth every 4 (four) hours as needed for moderate pain. 10/28/15   Lawyer, Cristal Deer, PA-C  albuterol (PROVENTIL HFA;VENTOLIN HFA) 108 (90 Base) MCG/ACT inhaler Inhale 1-2 puffs into  the lungs every 6 (six) hours as needed for wheezing or shortness of breath. 12/03/16   Ar[place] Barrette, MD  azithromycin (ZITHROMAX) 250 MG tablet Take 1 tablet (250 mg total) by mouth daily. Take first 2 tablets together, then 1 every day until finished. 09/25/12   Elson Areas, PA-C  benzonatate (TESSALON) 100 MG capsule Take 1 capsule (100 mg total) by mouth every 8 (eight) hours. 09/25/12   Elson Areas, PA-C  cephALEXin (KEFLEX) 500 MG capsule Take 1 capsule (500 mg total) by mouth 4 (four) times daily. 12/24/14   Hess, Nada Boozer, PA-C  ciprofloxacin (CIPRO) 250 MG tablet Take 1 tablet (250 mg total) by mouth every 12 (twelve) hours. 01/28/14   Purvis Sheffield, MD  diazepam (VALIUM) 5 MG tablet Take 1 tablet (5 mg total) by mouth every 6 (six) hours as needed for muscle spasms. 12/16/15   Leta Baptist, MD  doxycycline (VIBRAMYCIN) 100 MG capsule Take 1 capsule (100 mg total) by mouth 2 (two) times daily. One po bid x 7 days 05/23/16   Fayrene Helper, PA-C  Guaifenesin 1200 MG TB12 Take 1 tablet (1,200 mg total) by mouth 2 (two) times daily. 07/02/16   Lawyer, Cristal Deer, PA-C  ibuprofen (ADVIL,MOTRIN) 600 MG tablet Take 1 tablet (600 mg total) by mouth every 6 (six) hours as needed for moderate pain. 12/16/15   Leta Baptist, MD  meclizine (ANTIVERT) 25 MG tablet Take 1 tablet (25 mg total) by  mouth 3 (three) times daily as needed for dizziness. 12/03/16   [place] Barrette, MD  metroNIDAZOLE (FLAGYL) 500 MG tablet Take 1 tablet (500 mg total) by mouth 2 (two) times daily. One po bid x 7 days 12/24/14   Hess, Melina Schools M, PA-C  ondansetron (ZOFRAN ODT) 8 MG disintegrating tablet Take 1 tablet (8 mg total) by mouth every 8 (eight) hours as needed for nausea or vomiting. 10/04/15   Linwood Dibbles, MD  predniSONE (DELTASONE) 50 MG tablet Take 1 tablet (50 mg total) by mouth daily with breakfast. 07/02/16   Lawyer, Cristal Deer, PA-C  promethazine-dextromethorphan (PROMETHAZINE-DM) 6.25-15 MG/5ML syrup Take 5  mLs by mouth 4 (four) times daily as needed for cough. 07/02/16   Lawyer, Cristal Deer, PA-C  traMADol (ULTRAM) 50 MG tablet Take 1 tablet (50 mg total) by mouth every 6 (six) hours as needed. 01/29/15   Marlon Pel, PA-C    Family History History reviewed. No pertinent family history.  Social History Social History  Substance Use Topics  . Smoking status: Current Every Day Smoker    Types: Cigarettes  . Smokeless tobacco: Never Used  . Alcohol use Yes     Comment: occasionaly      Allergies   Naproxen   Review of Systems Review of Systems  All other systems reviewed and are negative for acute change except as noted in the HPI.   Physical Exam Updated Vital Signs BP 125/90 (BP Location: Left Arm)   Pulse 88   Temp 98.5 F (36.9 C) (Oral)   Resp 20   Ht 5\' 4"  (1.626 m)   Wt 104.3 kg (230 lb)   LMP 11/22/2016   SpO2 100%   BMI 39.48 kg/m   Physical Exam  Constitutional: She is oriented to person, place, and time. She appears well-developed and well-nourished.  HENT:  Head: Normocephalic and atraumatic.  Moderate cerumen in right TM. Left TM normal. Oral cavity moist posterior oropharynx widely patent.  Eyes: Conjunctivae and EOM are normal. Pupils are equal, round, and reactive to light.  Neck: Neck supple.  Cardiovascular: Normal rate, regular rhythm, normal heart sounds and intact distal pulses.   No murmur heard. Pulmonary/Chest: Effort normal and breath sounds normal. No respiratory distress.  Abdominal: Soft. There is no tenderness.  Musculoskeletal: Normal range of motion. She exhibits no edema or tenderness.  Calves are soft and nontender. No tenderness in the popliteal fossa.  Neurological: She is alert and oriented to person, place, and time. No cranial nerve deficit or sensory deficit. She exhibits normal muscle tone. Coordination normal.  Skin: Skin is warm and dry.  Psychiatric: She has a normal mood and affect.  Nursing note and vitals  reviewed.    ED Treatments / Results   DIAGNOSTIC STUDIES: Oxygen Saturation is 100% on RA, normal by my interpretation.   COORDINATION OF CARE: 6:12 PM-Discussed next steps with pt. Pt verbalized understanding and is agreeable with the plan.    Labs (all labs ordered are listed, but only abnormal results are displayed) Labs Reviewed - No data to display  EKG  EKG Interpretation None       Radiology Dg Chest 2 View  Result Date: 12/03/2016 CLINICAL DATA:  Nonproductive cough, dizziness, and lightheadedness for 2 days. EXAM: CHEST  2 VIEW COMPARISON:  08/23/2015 FINDINGS: The heart size and mediastinal contours are within normal limits. Both lungs are clear. The visualized skeletal structures are unremarkable. IMPRESSION: Negative.  No active cardiopulmonary disease. Electronically Signed   By: Jonny Ruiz  Eppie GibsonStahl M.D.   On: 12/03/2016 17:33    Procedures Procedures (including critical care time)  Medications Ordered in ED Medications - No data to display   Initial Impression / Assessment and Plan / ED Course  I have reviewed the triage vital signs and the nursing notes.  Pertinent labs & imaging results that were available during my care of the patient were reviewed by me and considered in my medical decision making (see chart for details).     Final Clinical Impressions(s) / ED Diagnoses   Final diagnoses:  Cough  Vertigo  No respiratory distress. Vital signs stable. Patient's symptoms started with vertiginous quality. She reports history of vertigo once previously. No neurologic dysfunction. Cough without fever, hemoptysis, or lower extremity swelling. Patient is PERC and Wells negative. At this time plan will be to treat with albuterol for cough and bronchitis as well as meclizine if needed. Patient's counselor necessity follow-up with PCP. She is made aware of referral information in discharge instructions. New Prescriptions New Prescriptions   ALBUTEROL (PROVENTIL  HFA;VENTOLIN HFA) 108 (90 BASE) MCG/ACT INHALER    Inhale 1-2 puffs into the lungs every 6 (six) hours as needed for wheezing or shortness of breath.   MECLIZINE (ANTIVERT) 25 MG TABLET    Take 1 tablet (25 mg total) by mouth 3 (three) times daily as needed for dizziness.       Arby BarrettePfeiffer, Wahid Holley, MD 12/03/16 [phone]

## 2016-12-08 ENCOUNTER — Emergency Department (HOSPITAL_BASED_OUTPATIENT_CLINIC_OR_DEPARTMENT_OTHER)
Admission: EM | Admit: 2016-12-08 | Discharge: 2016-12-08 | Disposition: A | Discharge status: Home / Self Care | Payer: Self-pay | Attending: Emergency Medicine | Admitting: Emergency Medicine

## 2016-12-08 ENCOUNTER — Encounter (HOSPITAL_BASED_OUTPATIENT_CLINIC_OR_DEPARTMENT_OTHER): Payer: Self-pay | Admitting: Emergency Medicine

## 2016-12-08 DIAGNOSIS — Y929 Unspecified place or not applicable: Secondary | ICD-10-CM | POA: Insufficient documentation

## 2016-12-08 DIAGNOSIS — Z79899 Other long term (current) drug therapy: Secondary | ICD-10-CM | POA: Insufficient documentation

## 2016-12-08 DIAGNOSIS — Y939 Activity, unspecified: Secondary | ICD-10-CM | POA: Insufficient documentation

## 2016-12-08 DIAGNOSIS — B379 Candidiasis, unspecified: Secondary | ICD-10-CM

## 2016-12-08 DIAGNOSIS — X58XXXA Exposure to other specified factors, initial encounter: Secondary | ICD-10-CM | POA: Insufficient documentation

## 2016-12-08 DIAGNOSIS — F17210 Nicotine dependence, cigarettes, uncomplicated: Secondary | ICD-10-CM | POA: Insufficient documentation

## 2016-12-08 DIAGNOSIS — T192XXA Foreign body in vulva and vagina, initial encounter: Secondary | ICD-10-CM | POA: Insufficient documentation

## 2016-12-08 DIAGNOSIS — Y999 Unspecified external cause status: Secondary | ICD-10-CM | POA: Insufficient documentation

## 2016-12-08 DIAGNOSIS — B373 Candidiasis of vulva and vagina: Secondary | ICD-10-CM | POA: Insufficient documentation

## 2016-12-08 LAB — WET PREP, GENITAL
Clue Cells Wet Prep HPF POC: NONE SEEN
Sperm: NONE SEEN
Trich, Wet Prep: NONE SEEN

## 2016-12-08 MED ORDER — FLUCONAZOLE 50 MG PO TABS
150.0000 mg | ORAL_TABLET | Freq: Once | ORAL | Status: AC
  Administered 2016-12-08: 150 mg via ORAL
  Filled 2016-12-08: qty 1

## 2016-12-08 NOTE — Discharge Instructions (Signed)
Return to the ED with any concerns including pelvic pain, vaginal bleeding, fever/chills, vomiting and not able to keep down liquids, decreased level of alertness/lethargy, or any other alarming symptoms

## 2016-12-08 NOTE — ED Provider Notes (Signed)
[facility]   CSN: [id number] Arrival date & time: 12/08/16  0046     History   Chief Complaint Chief Complaint  Patient presents with  . Vaginal Itching    HPI Bridget Griffin is a 31 y.o. female.  HPI  Pt presenting with c/o vaginal discomfort.  Pt states that she used a vaginal detox that had 6 inserts- she is not sure that all the inserts were removed yesterday- she removed some of them but is not sure she was able to remove all 6.  Today she began to feel vaginal irritation and itching simlar to how she has felt with prior yeast infections.  No abdominal or pelvic pain.  No fever/chills.  No dysuria.  No vaginal bleeding.  There are no other associated systemic symptoms, there are no other alleviating or modifying factors.   Past Medical History:  Diagnosis Date  . Bronchitis     There are no active problems to display for this patient.   Past Surgical History:  Procedure Laterality Date  . CYSTECTOMY    . SKIN BIOPSY      OB History    No data available       Home Medications    Prior to Admission medications   Medication Sig Start Date End Date Taking? Authorizing Provider  acetaminophen-codeine 120-12 MG/5ML solution Take 10 mLs by mouth every 4 (four) hours as needed for moderate pain. 10/28/15   Lawyer, Cristal Deer, PA-C  albuterol (PROVENTIL HFA;VENTOLIN HFA) 108 (90 Base) MCG/ACT inhaler Inhale 1-2 puffs into the lungs every 6 (six) hours as needed for wheezing or shortness of breath. 12/03/16   Arby Barrette, MD  azithromycin (ZITHROMAX) 250 MG tablet Take 1 tablet (250 mg total) by mouth daily. Take first 2 tablets together, then 1 every day until finished. 09/25/12   Elson Areas, PA-C  benzonatate (TESSALON) 100 MG capsule Take 1 capsule (100 mg total) by mouth every 8 (eight) hours. 09/25/12   Elson Areas, PA-C  cephALEXin (KEFLEX) 500 MG capsule Take 1 capsule (500 mg total) by mouth 4 (four) times daily. 12/24/14   Hess,  Nada Boozer, PA-C  ciprofloxacin (CIPRO) 250 MG tablet Take 1 tablet (250 mg total) by mouth every 12 (twelve) hours. 01/28/14   Purvis Sheffield, MD  diazepam (VALIUM) 5 MG tablet Take 1 tablet (5 mg total) by mouth every 6 (six) hours as needed for muscle spasms. 12/16/15   Leta Baptist, MD  doxycycline (VIBRAMYCIN) 100 MG capsule Take 1 capsule (100 mg total) by mouth 2 (two) times daily. One po bid x 7 days 05/23/16   Fayrene Helper, PA-C  Guaifenesin 1200 MG TB12 Take 1 tablet (1,200 mg total) by mouth 2 (two) times daily. 07/02/16   Lawyer, Cristal Deer, PA-C  ibuprofen (ADVIL,MOTRIN) 600 MG tablet Take 1 tablet (600 mg total) by mouth every 6 (six) hours as needed for moderate pain. 12/16/15   Leta Baptist, MD  meclizine (ANTIVERT) 25 MG tablet Take 1 tablet (25 mg total) by mouth 3 (three) times daily as needed for dizziness. 12/03/16   Arby Barrette, MD  metroNIDAZOLE (FLAGYL) 500 MG tablet Take 1 tablet (500 mg total) by mouth 2 (two) times daily. One po bid x 7 days 12/24/14   Hess, Melina Schools M, PA-C  ondansetron (ZOFRAN ODT) 8 MG disintegrating tablet Take 1 tablet (8 mg total) by mouth every 8 (eight) hours as needed for nausea or vomiting. 10/04/15   Linwood Dibbles, MD  predniSONE (DELTASONE) 50 MG tablet Take 1 tablet (50 mg total) by mouth daily with breakfast. 07/02/16   Lawyer, Cristal Deer, PA-C  promethazine-dextromethorphan (PROMETHAZINE-DM) 6.25-15 MG/5ML syrup Take 5 mLs by mouth 4 (four) times daily as needed for cough. 07/02/16   Lawyer, Cristal Deer, PA-C  traMADol (ULTRAM) 50 MG tablet Take 1 tablet (50 mg total) by mouth every 6 (six) hours as needed. 01/29/15   Marlon Pel, PA-C    Family History No family history on file.  Social History Social History  Substance Use Topics  . Smoking status: Current Every Day Smoker    Types: Cigarettes  . Smokeless tobacco: Never Used  . Alcohol use Yes     Comment: occasionaly      Allergies   Naproxen   Review of  Systems Review of Systems  ROS reviewed and all otherwise negative except for mentioned in HPI   Physical Exam Updated Vital Signs BP 122/67 (BP Location: Right Arm)   Pulse 65   Temp 98.1 F (36.7 C) (Oral)   Resp 16   Ht 5\' 4"  (1.626 m)   Wt 104.3 kg (230 lb)   LMP 11/22/2016   SpO2 100%   BMI 39.48 kg/m  Vitals reviewed Physical Exam Physical Examination: General appearance - alert, well appearing, and in no distress Mental status - alert, oriented to person, place, and time Eyes - no conjunctival injection, no scleral icterus Abdomen - soft, nontender, nondistended, no masses or organomegaly Pelvic - normal external genitalia, vulva,, cervix, uterus and adnexa, vaginal walls irriatated with adherent white discharge, 3 small foreign bodies removed- small vaginal inserts approx 1cm each Neurological - alert, oriented, normal speech, no focal findings or movement disorder noted Extremities - peripheral pulses normal, no pedal edema, no clubbing or cyanosis Skin - normal coloration and turgor, no rashes  ED Treatments / Results  Labs (all labs ordered are listed, but only abnormal results are displayed) Labs Reviewed  WET PREP, GENITAL - Abnormal; Notable for the following:       Result Value   Yeast Wet Prep HPF POC PRESENT (*)    WBC, Wet Prep HPF POC FEW (*)    All other components within normal limits  GC/CHLAMYDIA PROBE AMP ([place]) NOT AT [facility]    EKG  EKG Interpretation None       Radiology No results found.  Procedures .Foreign Body Removal Date/Time: 12/08/2016 2:46 AM Performed by: Jerelyn Scott Authorized by: Jerelyn Scott  Consent: Verbal consent obtained. Consent given by: patient Patient understanding: patient states understanding of the procedure being performed Patient identity confirmed: verbally with patient and arm band Body area: vagina Localization method: visualized and speculum Removal mechanism: ring forceps 3 objects  recovered. Objects recovered: vaginal herbal inserts Post-procedure assessment: foreign body removed Patient tolerance: Patient tolerated the procedure well with no immediate complications   (including critical care time)  Medications Ordered in ED Medications  fluconazole (DIFLUCAN) tablet 150 mg (150 mg Oral Given 12/08/16 0307)     Initial Impression / Assessment and Plan / ED Course  I have reviewed the triage vital signs and the nursing notes.  Pertinent labs & imaging results that were available during my care of the patient were reviewed by me and considered in my medical decision making (see chart for details).     Pt presenting with c/o vaginal irritation after using vaginal detox inserts.  On exam there were 3 inserts retained that were removed, wet prep showed yeast.  Pt treatedwith  diflucan.  genprobe obtained as well.  Discharged with strict return precautions.  Pt agreeable with plan.  Final Clinical Impressions(s) / ED Diagnoses   Final diagnoses:  Yeast infection  Foreign body in vagina, initial encounter    New Prescriptions Discharge Medication List as of 12/08/2016  3:03 AM       Jerelyn ScottLinker, Ambyr Qadri, MD 12/09/16 [phone]

## 2016-12-08 NOTE — ED Triage Notes (Signed)
Pt reports doing a vaginal cleanse she started 3-4 days ago and now c/o vaginal irritation.

## 2016-12-09 LAB — GC/CHLAMYDIA PROBE AMP (~~LOC~~) NOT AT ARMC
CHLAMYDIA, DNA PROBE: NEGATIVE
NEISSERIA GONORRHEA: NEGATIVE

## 2017-01-18 ENCOUNTER — Encounter (HOSPITAL_BASED_OUTPATIENT_CLINIC_OR_DEPARTMENT_OTHER): Payer: Self-pay

## 2017-01-18 ENCOUNTER — Emergency Department (HOSPITAL_BASED_OUTPATIENT_CLINIC_OR_DEPARTMENT_OTHER)
Admission: EM | Admit: 2017-01-18 | Discharge: 2017-01-18 | Disposition: A | Discharge status: Home / Self Care | Payer: Self-pay | Attending: Emergency Medicine | Admitting: Emergency Medicine

## 2017-01-18 DIAGNOSIS — J0300 Acute streptococcal tonsillitis, unspecified: Secondary | ICD-10-CM | POA: Insufficient documentation

## 2017-01-18 DIAGNOSIS — F17210 Nicotine dependence, cigarettes, uncomplicated: Secondary | ICD-10-CM | POA: Insufficient documentation

## 2017-01-18 DIAGNOSIS — Z79899 Other long term (current) drug therapy: Secondary | ICD-10-CM | POA: Insufficient documentation

## 2017-01-18 LAB — RAPID STREP SCREEN (MED CTR MEBANE ONLY): STREPTOCOCCUS, GROUP A SCREEN (DIRECT): POSITIVE — AB

## 2017-01-18 MED ORDER — IBUPROFEN 400 MG PO TABS
600.0000 mg | ORAL_TABLET | Freq: Once | ORAL | Status: AC
  Administered 2017-01-18: 600 mg via ORAL
  Filled 2017-01-18: qty 1

## 2017-01-18 MED ORDER — PENICILLIN G BENZATHINE 1200000 UNIT/2ML IM SUSP
1.2000 10*6.[IU] | Freq: Once | INTRAMUSCULAR | Status: AC
  Administered 2017-01-18: 1.2 10*6.[IU] via INTRAMUSCULAR
  Filled 2017-01-18: qty 2

## 2017-01-18 MED ORDER — DEXAMETHASONE 6 MG PO TABS
10.0000 mg | ORAL_TABLET | Freq: Once | ORAL | Status: AC
  Administered 2017-01-18: 10 mg via ORAL
  Filled 2017-01-18: qty 1

## 2017-01-18 NOTE — Discharge Instructions (Signed)
Take acetaminophen or ibuprofen as needed for pain or fever.

## 2017-01-18 NOTE — ED Triage Notes (Signed)
Pt reports 1 day history of bilateral ear pain, sore throat - states Tylenol taken at 1200 without effect noted. Denies sick contacts or other symptoms.

## 2017-01-18 NOTE — ED Provider Notes (Signed)
[facility] Provider Note   CSN: [id number] Arrival date & time: 01/18/17  0402     History   Chief Complaint Chief Complaint  Patient presents with  . Otalgia    HPI Bridget Griffin is a 31 y.o. female.  The history is provided by the patient.  She had onset yesterday of pain in her right ear and sore throat. Pain is worse with swallowing. When she swallows, pain does shoot into her ear. She rates pain at 7/10. She denies fever, chills, sweats. She denies any sick contacts. She does state that she had been doing a lot of cleaning 2 days ago, and been exposed to a lot of dirt and dust.  Past Medical History:  Diagnosis Date  . Bronchitis     There are no active problems to display for this patient.   Past Surgical History:  Procedure Laterality Date  . CYSTECTOMY    . SKIN BIOPSY      OB History    No data available       Home Medications    Prior to Admission medications   Medication Sig Start Date End Date Taking? Authorizing Provider  albuterol (PROVENTIL HFA;VENTOLIN HFA) 108 (90 Base) MCG/ACT inhaler Inhale 1-2 puffs into the lungs every 6 (six) hours as needed for wheezing or shortness of breath. 12/03/16  Yes Arby BarrettePfeiffer, Marcy, MD  benzonatate (TESSALON) 100 MG capsule Take 1 capsule (100 mg total) by mouth every 8 (eight) hours. 09/25/12  Yes Elson AreasSofia, Leslie K, PA-C  Guaifenesin 1200 MG TB12 Take 1 tablet (1,200 mg total) by mouth 2 (two) times daily. 07/02/16  Yes Lawyer, Cristal Deerhristopher, PA-C  ibuprofen (ADVIL,MOTRIN) 600 MG tablet Take 1 tablet (600 mg total) by mouth every 6 (six) hours as needed for moderate pain. 12/16/15  Yes Leta BaptistNguyen, Emily Roe, MD  acetaminophen-codeine 120-12 MG/5ML solution Take 10 mLs by mouth every 4 (four) hours as needed for moderate pain. 10/28/15   Lawyer, Cristal Deerhristopher, PA-C  azithromycin (ZITHROMAX) 250 MG tablet Take 1 tablet (250 mg total) by mouth daily. Take first 2 tablets together, then 1 every day until finished. 09/25/12    Elson AreasSofia, Leslie K, PA-C  cephALEXin (KEFLEX) 500 MG capsule Take 1 capsule (500 mg total) by mouth 4 (four) times daily. 12/24/14   Hess, Nada Boozerobyn M, PA-C  ciprofloxacin (CIPRO) 250 MG tablet Take 1 tablet (250 mg total) by mouth every 12 (twelve) hours. 01/28/14   Purvis SheffieldHarrison, Forrest, MD  diazepam (VALIUM) 5 MG tablet Take 1 tablet (5 mg total) by mouth every 6 (six) hours as needed for muscle spasms. 12/16/15   Leta BaptistNguyen, Emily Roe, MD  doxycycline (VIBRAMYCIN) 100 MG capsule Take 1 capsule (100 mg total) by mouth 2 (two) times daily. One po bid x 7 days 05/23/16   Fayrene Helperran, Bowie, PA-C  meclizine (ANTIVERT) 25 MG tablet Take 1 tablet (25 mg total) by mouth 3 (three) times daily as needed for dizziness. 12/03/16   Arby BarrettePfeiffer, Marcy, MD  metroNIDAZOLE (FLAGYL) 500 MG tablet Take 1 tablet (500 mg total) by mouth 2 (two) times daily. One po bid x 7 days 12/24/14   Hess, Melina Schoolsobyn M, PA-C  ondansetron (ZOFRAN ODT) 8 MG disintegrating tablet Take 1 tablet (8 mg total) by mouth every 8 (eight) hours as needed for nausea or vomiting. 10/04/15   Linwood DibblesKnapp, Jon, MD  predniSONE (DELTASONE) 50 MG tablet Take 1 tablet (50 mg total) by mouth daily with breakfast. 07/02/16   Charlestine NightLawyer, Christopher, PA-C  promethazine-dextromethorphan (PROMETHAZINE-DM) 6.25-15  MG/5ML syrup Take 5 mLs by mouth 4 (four) times daily as needed for cough. 07/02/16   Lawyer, Cristal Deer, PA-C  traMADol (ULTRAM) 50 MG tablet Take 1 tablet (50 mg total) by mouth every 6 (six) hours as needed. 01/29/15   Marlon Pel, PA-C    Family History History reviewed. No pertinent family history.  Social History Social History  Substance Use Topics  . Smoking status: Current Every Day Smoker    Types: Cigarettes  . Smokeless tobacco: Never Used  . Alcohol use Yes     Comment: occasionaly      Allergies   Naproxen   Review of Systems Review of Systems  All other systems reviewed and are negative.    Physical Exam Updated Vital Signs BP 126/86 (BP Location:  Left Arm)   Pulse 80   Temp 98.6 F (37 C) (Oral)   Resp 18   Ht 5\' 4"  (1.626 m)   Wt 104.3 kg (230 lb)   LMP 01/04/2017   SpO2 100%   BMI 39.48 kg/m   Physical Exam  Nursing note and vitals reviewed.  31 year old female, resting comfortably and in no acute distress. Vital signs are normal. Oxygen saturation is 100%, which is normal. Head is normocephalic and atraumatic. PERRLA, EOMI. Oropharynx is shows bilateral tonsillar erythema and exudate. There is no pooling of secretions. Phonation is normal. Neck is nontender and supple without adenopathy or JVD. Back is nontender and there is no CVA tenderness. Lungs are clear without rales, wheezes, or rhonchi. Chest is nontender. Heart has regular rate and rhythm without murmur. Abdomen is soft, flat, nontender without masses or hepatosplenomegaly and peristalsis is normoactive. Extremities have no cyanosis or edema, full range of motion is present. Skin is warm and dry without rash. Neurologic: Mental status is normal, cranial nerves are intact, there are no motor or sensory deficits.  ED Treatments / Results  Labs (all labs ordered are listed, but only abnormal results are displayed) Labs Reviewed  RAPID STREP SCREEN (NOT AT [facility]) - Abnormal; Notable for the following:       Result Value   Streptococcus, Group A Screen (Direct) POSITIVE (*)    All other components within normal limits   Procedures Procedures (including critical care time)  Medications Ordered in ED Medications  dexamethasone (DECADRON) tablet 10 mg (not administered)  penicillin g benzathine (BICILLIN LA) 1200000 UNIT/2ML injection 1.2 Million Units (not administered)  ibuprofen (ADVIL,MOTRIN) tablet 600 mg (600 mg Oral Given 01/18/17 0433)     Initial Impression / Assessment and Plan / ED Course  I have reviewed the triage vital signs and the nursing notes.  Pertinent lab results that were available during my care of the patient were reviewed by me and  considered in my medical decision making (see chart for details).  Streptococcal tonsillitis. Patient was given option of oral versus parenteral penicillin. She has requested parenteral penicillin. She is given injection of Bicillin, and also given a dose of oral dexamethasone. Work release given for 24 hours. Old records are reviewed, and she does have a prior ED visit for pharyngitis with referred ear pain.  Final Clinical Impressions(s) / ED Diagnoses   Final diagnoses:  Streptococcal tonsillitis    New Prescriptions New Prescriptions   No medications on file     Dione Booze, MD 01/18/17 [phone]

## 2017-10-23 ENCOUNTER — Emergency Department (HOSPITAL_BASED_OUTPATIENT_CLINIC_OR_DEPARTMENT_OTHER)
Admission: EM | Admit: 2017-10-23 | Discharge: 2017-10-23 | Disposition: A | Discharge status: Home / Self Care | Payer: Self-pay | Attending: Emergency Medicine | Admitting: Emergency Medicine

## 2017-10-23 ENCOUNTER — Other Ambulatory Visit: Payer: Self-pay

## 2017-10-23 ENCOUNTER — Encounter (HOSPITAL_BASED_OUTPATIENT_CLINIC_OR_DEPARTMENT_OTHER): Payer: Self-pay | Admitting: Emergency Medicine

## 2017-10-23 ENCOUNTER — Emergency Department (HOSPITAL_BASED_OUTPATIENT_CLINIC_OR_DEPARTMENT_OTHER): Payer: Self-pay

## 2017-10-23 DIAGNOSIS — R0602 Shortness of breath: Secondary | ICD-10-CM | POA: Insufficient documentation

## 2017-10-23 DIAGNOSIS — R05 Cough: Secondary | ICD-10-CM | POA: Insufficient documentation

## 2017-10-23 DIAGNOSIS — Z79899 Other long term (current) drug therapy: Secondary | ICD-10-CM | POA: Insufficient documentation

## 2017-10-23 DIAGNOSIS — R0789 Other chest pain: Secondary | ICD-10-CM

## 2017-10-23 DIAGNOSIS — Z8249 Family history of ischemic heart disease and other diseases of the circulatory system: Secondary | ICD-10-CM | POA: Insufficient documentation

## 2017-10-23 DIAGNOSIS — F17210 Nicotine dependence, cigarettes, uncomplicated: Secondary | ICD-10-CM | POA: Insufficient documentation

## 2017-10-23 DIAGNOSIS — R079 Chest pain, unspecified: Secondary | ICD-10-CM | POA: Insufficient documentation

## 2017-10-23 LAB — CBC WITH DIFFERENTIAL/PLATELET
BASOS ABS: 0 10*3/uL (ref 0.0–0.1)
Basophils Relative: 0 %
EOS PCT: 2 %
Eosinophils Absolute: 0.2 10*3/uL (ref 0.0–0.7)
HEMATOCRIT: 36.3 % (ref 36.0–46.0)
HEMOGLOBIN: 12.2 g/dL (ref 12.0–15.0)
LYMPHS PCT: 33 %
Lymphs Abs: 3.1 10*3/uL (ref 0.7–4.0)
MCH: 26.6 pg (ref 26.0–34.0)
MCHC: 33.6 g/dL (ref 30.0–36.0)
MCV: 79.1 fL (ref 78.0–100.0)
Monocytes Absolute: 0.8 10*3/uL (ref 0.1–1.0)
Monocytes Relative: 8 %
NEUTROS ABS: 5.5 10*3/uL (ref 1.7–7.7)
Neutrophils Relative %: 57 %
PLATELETS: 256 10*3/uL (ref 150–400)
RBC: 4.59 MIL/uL (ref 3.87–5.11)
RDW: 14.8 % (ref 11.5–15.5)
WBC: 9.6 10*3/uL (ref 4.0–10.5)

## 2017-10-23 LAB — D-DIMER, QUANTITATIVE: D-Dimer, Quant: 0.27 ug/mL-FEU (ref 0.00–0.50)

## 2017-10-23 LAB — COMPREHENSIVE METABOLIC PANEL
ALK PHOS: 70 U/L (ref 38–126)
ALT: 26 U/L (ref 14–54)
AST: 24 U/L (ref 15–41)
Albumin: 3.9 g/dL (ref 3.5–5.0)
Anion gap: 9 (ref 5–15)
BUN: 13 mg/dL (ref 6–20)
CHLORIDE: 106 mmol/L (ref 101–111)
CO2: 21 mmol/L — AB (ref 22–32)
CREATININE: 0.96 mg/dL (ref 0.44–1.00)
Calcium: 8.8 mg/dL — ABNORMAL LOW (ref 8.9–10.3)
GFR calc Af Amer: >60 mL/min (ref >60)
Glucose, Bld: 98 mg/dL (ref 65–99)
Potassium: 3.6 mmol/L (ref 3.5–5.1)
Sodium: 136 mmol/L (ref 135–145)
Total Bilirubin: 0.5 mg/dL (ref 0.3–1.2)
Total Protein: 6.9 g/dL (ref 6.5–8.1)

## 2017-10-23 LAB — TROPONIN I: Troponin I: <0.03 ng/mL (ref <0.03)

## 2017-10-23 MED ORDER — METHOCARBAMOL 500 MG PO TABS: 1000.0000 mg | ORAL_TABLET | Freq: Three times a day (TID) | ORAL | 0 refills | Status: DC | PRN

## 2017-10-23 MED FILL — METHOCARBAMOL 500 MG TABLET: 500 | 3 days supply | Qty: 20 | Fill #0

## 2017-10-23 NOTE — ED Provider Notes (Signed)
[facility] EMERGENCY DEPARTMENT Provider Note   CSN: [id number] Arrival date & time: 10/23/17  7829     History   Chief Complaint Chief Complaint  Patient presents with  . Cough    HPI Bridget Griffin is a 32 y.o. female.  HPI Patient presents with sharp left-sided chest pain that started this morning.  She had associated shortness of breath.  Thinks the pain is worse with deep breathing and coughing.  She has had nonproductive cough for the past 2 days and has been using her inhaler.  She also states that she recently was heavy lifting while at work and thinks she may have pulled muscle.  The patient however came to the emergency department because she has strong family history of coronary artery disease on the father's side of family.  States both her father and younger brother both died of MI's.  Patient states that her brother was 71.  She denies any fever or chills.  No lower extremity swelling or asymmetry.  No extended travel. Past Medical History:  Diagnosis Date  . Bronchitis     There are no active problems to display for this patient.   Past Surgical History:  Procedure Laterality Date  . CYSTECTOMY    . SKIN BIOPSY       OB History   None      Home Medications    Prior to Admission medications   Medication Sig Start Date End Date Taking? Authorizing Provider  acetaminophen-codeine 120-12 MG/5ML solution Take 10 mLs by mouth every 4 (four) hours as needed for moderate pain. 10/28/15   Lawyer, Cristal Deer, PA-C  albuterol (PROVENTIL HFA;VENTOLIN HFA) 108 (90 Base) MCG/ACT inhaler Inhale 1-2 puffs into the lungs every 6 (six) hours as needed for wheezing or shortness of breath. 12/03/16   Arby Barrette, MD  azithromycin (ZITHROMAX) 250 MG tablet Take 1 tablet (250 mg total) by mouth daily. Take first 2 tablets together, then 1 every day until finished. 09/25/12   Elson Areas, PA-C  benzonatate (TESSALON) 100 MG capsule Take 1 capsule (100 mg  total) by mouth every 8 (eight) hours. 09/25/12   Elson Areas, PA-C  cephALEXin (KEFLEX) 500 MG capsule Take 1 capsule (500 mg total) by mouth 4 (four) times daily. 12/24/14   Hess, Nada Boozer, PA-C  ciprofloxacin (CIPRO) 250 MG tablet Take 1 tablet (250 mg total) by mouth every 12 (twelve) hours. 01/28/14   Purvis Sheffield, MD  diazepam (VALIUM) 5 MG tablet Take 1 tablet (5 mg total) by mouth every 6 (six) hours as needed for muscle spasms. 12/16/15   Leta Baptist, MD  doxycycline (VIBRAMYCIN) 100 MG capsule Take 1 capsule (100 mg total) by mouth 2 (two) times daily. One po bid x 7 days 05/23/16   Fayrene Helper, PA-C  Guaifenesin 1200 MG TB12 Take 1 tablet (1,200 mg total) by mouth 2 (two) times daily. 07/02/16   Lawyer, Cristal Deer, PA-C  ibuprofen (ADVIL,MOTRIN) 600 MG tablet Take 1 tablet (600 mg total) by mouth every 6 (six) hours as needed for moderate pain. 12/16/15   Leta Baptist, MD  meclizine (ANTIVERT) 25 MG tablet Take 1 tablet (25 mg total) by mouth 3 (three) times daily as needed for dizziness. 12/03/16   Arby Barrette, MD  methocarbamol (ROBAXIN) 500 MG tablet Take 2 tablets (1,000 mg total) by mouth every 8 (eight) hours as needed for muscle spasms. 10/23/17   Loren Racer, MD  metroNIDAZOLE (FLAGYL) 500 MG tablet Take 1  tablet (500 mg total) by mouth 2 (two) times daily. One po bid x 7 days 12/24/14   Hess, Melina Schools M, PA-C  ondansetron (ZOFRAN ODT) 8 MG disintegrating tablet Take 1 tablet (8 mg total) by mouth every 8 (eight) hours as needed for nausea or vomiting. 10/04/15   Linwood Dibbles, MD  predniSONE (DELTASONE) 50 MG tablet Take 1 tablet (50 mg total) by mouth daily with breakfast. 07/02/16   Lawyer, Cristal Deer, PA-C  promethazine-dextromethorphan (PROMETHAZINE-DM) 6.25-15 MG/5ML syrup Take 5 mLs by mouth 4 (four) times daily as needed for cough. 07/02/16   Lawyer, Cristal Deer, PA-C  traMADol (ULTRAM) 50 MG tablet Take 1 tablet (50 mg total) by mouth every 6 (six) hours as  needed. 01/29/15   Marlon Pel, PA-C    Family History History reviewed. No pertinent family history.  Social History Social History   Tobacco Use  . Smoking status: Current Every Day Smoker    Types: Cigarettes  . Smokeless tobacco: Never Used  Substance Use Topics  . Alcohol use: Yes    Comment: occasionaly   . Drug use: No     Allergies   Naproxen   Review of Systems Review of Systems  Constitutional: Negative for chills and fever.  HENT: Negative for congestion, sore throat and trouble swallowing.   Eyes: Negative for visual disturbance.  Respiratory: Positive for cough, shortness of breath and wheezing.   Cardiovascular: Positive for chest pain. Negative for palpitations and leg swelling.  Gastrointestinal: Negative for abdominal pain, constipation, diarrhea and nausea.  Genitourinary: Negative for dysuria, flank pain and frequency.  Musculoskeletal: Negative for back pain, myalgias, neck pain and neck stiffness.  Skin: Negative for rash and wound.  Neurological: Negative for dizziness, weakness, light-headedness, numbness and headaches.  All other systems reviewed and are negative.    Physical Exam Updated Vital Signs BP 131/84   Pulse 70   Temp 98.2 F (36.8 C) (Oral)   Resp 14   Ht 5\' 4"  (1.626 m)   Wt 97.5 kg (215 lb)   LMP 09/25/2017   SpO2 100%   BMI 36.90 kg/m   Physical Exam  Constitutional: She is oriented to person, place, and time. She appears well-developed and well-nourished. No distress.  HENT:  Head: Normocephalic and atraumatic.  Mouth/Throat: Oropharynx is clear and moist. No oropharyngeal exudate.  Eyes: Pupils are equal, round, and reactive to light. EOM are normal.  Neck: Normal range of motion. Neck supple.  Cardiovascular: Normal rate and regular rhythm.  Pulmonary/Chest: Effort normal and breath sounds normal. She exhibits tenderness.  Mild left-sided chest wall tenderness without crepitance or deformity.  Abdominal: Soft.  Bowel sounds are normal. There is no tenderness. There is no rebound and no guarding.  Musculoskeletal: Normal range of motion. She exhibits no edema or tenderness.  No lower extremity swelling, asymmetry or tenderness.  Neurological: She is alert and oriented to person, place, and time.  Skin: Skin is warm and dry. No rash noted. She is not diaphoretic. No erythema.  Psychiatric: She has a normal mood and affect. Her behavior is normal.  Nursing note and vitals reviewed.    ED Treatments / Results  Labs (all labs ordered are listed, but only abnormal results are displayed) Labs Reviewed  COMPREHENSIVE METABOLIC PANEL - Abnormal; Notable for the following components:      Result Value   CO2 21 (*)    Calcium 8.8 (*)    All other components within normal limits  CBC WITH DIFFERENTIAL/PLATELET  TROPONIN  I  D-DIMER, QUANTITATIVE (NOT AT [facility])  TROPONIN I    EKG EKG Interpretation  Date/Time:  Monday October 23 2017 07:34:26 EDT Ventricular Rate:  81 PR Interval:    QRS Duration: 87 QT Interval:  381 QTC Calculation: 443 R Axis:   48 Text Interpretation:  Sinus rhythm Baseline wander in lead(s) V6 Confirmed by Loren RacerYelverton, Lexus Barletta ([id number]) on 10/23/2017 8:02:43 AM   Radiology Dg Chest 2 View  Result Date: 10/23/2017 CLINICAL DATA:  Cough, wheezing and shortness of breath for 2 days, smoker EXAM: CHEST - 2 VIEW COMPARISON:  12/03/2016 FINDINGS: Normal heart size, mediastinal contours, and pulmonary vascularity. Lungs clear. No pleural effusion or pneumothorax. Bones unremarkable. IMPRESSION: Normal exam. Electronically Signed   By: Ulyses SouthwardMark  Boles M.D.   On: 10/23/2017 08:02    Procedures Procedures (including critical care time)  Medications Ordered in ED Medications - No data to display   Initial Impression / Assessment and Plan / ED Course  I have reviewed the triage vital signs and the nursing notes.  Pertinent labs & imaging results that were available during my care of the  patient were reviewed by me and considered in my medical decision making (see chart for details).     Patient continues to have normal vital signs.  EKG without ischemic findings.  Normal d-dimer.  Troponin x2 is normal.  Low suspicion this is related to coronary artery disease.  Likely musculoskeletal.  Patient does have significant family history and have advised follow-up with cardiology.  Return precautions given.  Final Clinical Impressions(s) / ED Diagnoses   Final diagnoses:  Atypical chest pain    ED Discharge Orders        Ordered    methocarbamol (ROBAXIN) 500 MG tablet  Every 8 hours PRN     10/23/17 1132       Loren RacerYelverton, Celesta Funderburk, MD 10/23/17 1133

## 2017-10-23 NOTE — ED Notes (Addendum)
ED Provider at bedside.  Pt verbalized understanding of discharge instructions and denies any further questions at this time.   

## 2017-10-23 NOTE — ED Triage Notes (Signed)
Patient states that right around the pollen season she gets "bronchitis" and SOB - the patient states that she has had SOB, and cough x 2 -3 days - when she woke up this am she started to have Chest pain

## 2018-08-03 ENCOUNTER — Other Ambulatory Visit: Payer: Self-pay

## 2018-08-03 ENCOUNTER — Emergency Department (HOSPITAL_BASED_OUTPATIENT_CLINIC_OR_DEPARTMENT_OTHER)
Admission: EM | Admit: 2018-08-03 | Discharge: 2018-08-03 | Disposition: A | Discharge status: Home / Self Care | Payer: BLUE CROSS/BLUE SHIELD | Attending: Emergency Medicine | Admitting: Emergency Medicine

## 2018-08-03 ENCOUNTER — Encounter (HOSPITAL_BASED_OUTPATIENT_CLINIC_OR_DEPARTMENT_OTHER): Payer: Self-pay | Admitting: Emergency Medicine

## 2018-08-03 DIAGNOSIS — R69 Illness, unspecified: Secondary | ICD-10-CM

## 2018-08-03 DIAGNOSIS — J111 Influenza due to unidentified influenza virus with other respiratory manifestations: Secondary | ICD-10-CM | POA: Diagnosis not present

## 2018-08-03 DIAGNOSIS — F17210 Nicotine dependence, cigarettes, uncomplicated: Secondary | ICD-10-CM | POA: Insufficient documentation

## 2018-08-03 DIAGNOSIS — R509 Fever, unspecified: Secondary | ICD-10-CM | POA: Diagnosis present

## 2018-08-03 DIAGNOSIS — Z79899 Other long term (current) drug therapy: Secondary | ICD-10-CM | POA: Insufficient documentation

## 2018-08-03 MED ORDER — BENZONATATE 200 MG PO CAPS: 200.0000 mg | ORAL_CAPSULE | Freq: Three times a day (TID) | ORAL | 0 refills | Status: DC | PRN

## 2018-08-03 MED ORDER — OSELTAMIVIR PHOSPHATE 75 MG PO CAPS
75.0000 mg | ORAL_CAPSULE | Freq: Once | ORAL | Status: AC
  Administered 2018-08-03: 75 mg via ORAL
  Filled 2018-08-03: qty 1

## 2018-08-03 MED ORDER — ACETAMINOPHEN 500 MG PO TABS
1000.0000 mg | ORAL_TABLET | Freq: Once | ORAL | Status: AC
  Administered 2018-08-03: 1000 mg via ORAL
  Filled 2018-08-03: qty 2

## 2018-08-03 MED ORDER — OSELTAMIVIR PHOSPHATE 75 MG PO CAPS: 75.0000 mg | ORAL_CAPSULE | Freq: Two times a day (BID) | ORAL | 0 refills | Status: DC

## 2018-08-03 MED ORDER — ALBUTEROL SULFATE HFA 108 (90 BASE) MCG/ACT IN AERS: 2.0000 | INHALATION_SPRAY | RESPIRATORY_TRACT | 2 refills | Status: AC | PRN

## 2018-08-03 NOTE — ED Triage Notes (Signed)
Pt c/o cold like symptoms for the past few days getting worse with fever and chills.

## 2018-08-03 NOTE — ED Notes (Signed)
Pt understood dc material. NAD noted. Scripts given electronically. Drs note given at [employer]

## 2018-08-03 NOTE — ED Provider Notes (Signed)
[facility] EMERGENCY DEPARTMENT Provider Note   CSN: [id number] Arrival date & time: 08/03/18  2246     History   Chief Complaint Chief Complaint  Patient presents with  . Fever    HPI Bridget Griffin is a 33 y.o. female.  Patient presents to the emergency department for evaluation of fever and cough.  Symptoms began yesterday.  She reports a history of recurrent bronchitis and upper respiratory infection symptoms.  She noticed a cough yesterday but today has developed onset of generalized malaise, myalgias and fever in addition to the cough.  No shortness of breath.  No wheezing.     Past Medical History:  Diagnosis Date  . Bronchitis     There are no active problems to display for this patient.   Past Surgical History:  Procedure Laterality Date  . CYSTECTOMY    . SKIN BIOPSY       OB History   No obstetric history on file.      Home Medications    Prior to Admission medications   Medication Sig Start Date End Date Taking? Authorizing Provider  acetaminophen-codeine 120-12 MG/5ML solution Take 10 mLs by mouth every 4 (four) hours as needed for moderate pain. 10/28/15   Lawyer, Cristal Deerhristopher, PA-C  albuterol (PROVENTIL HFA;VENTOLIN HFA) 108 (90 Base) MCG/ACT inhaler Inhale 2 puffs into the lungs every 4 (four) hours as needed for wheezing or shortness of breath. 08/03/18   Gilda CreasePollina, Steffen Hase J, MD  azithromycin (ZITHROMAX) 250 MG tablet Take 1 tablet (250 mg total) by mouth daily. Take first 2 tablets together, then 1 every day until finished. 09/25/12   Elson AreasSofia, Leslie K, PA-C  benzonatate (TESSALON) 200 MG capsule Take 1 capsule (200 mg total) by mouth 3 (three) times daily as needed for cough. 08/03/18   Gilda CreasePollina, Eston Heslin J, MD  cephALEXin (KEFLEX) 500 MG capsule Take 1 capsule (500 mg total) by mouth 4 (four) times daily. 12/24/14   Hess, Nada Boozerobyn M, PA-C  ciprofloxacin (CIPRO) 250 MG tablet Take 1 tablet (250 mg total) by mouth every 12 (twelve) hours.  01/28/14   Purvis SheffieldHarrison, Forrest, MD  diazepam (VALIUM) 5 MG tablet Take 1 tablet (5 mg total) by mouth every 6 (six) hours as needed for muscle spasms. 12/16/15   Leta BaptistNguyen, Emily Roe, MD  doxycycline (VIBRAMYCIN) 100 MG capsule Take 1 capsule (100 mg total) by mouth 2 (two) times daily. One po bid x 7 days 05/23/16   Fayrene Helperran, Bowie, PA-C  Guaifenesin 1200 MG TB12 Take 1 tablet (1,200 mg total) by mouth 2 (two) times daily. 07/02/16   Lawyer, Cristal Deerhristopher, PA-C  ibuprofen (ADVIL,MOTRIN) 600 MG tablet Take 1 tablet (600 mg total) by mouth every 6 (six) hours as needed for moderate pain. 12/16/15   Leta BaptistNguyen, Emily Roe, MD  meclizine (ANTIVERT) 25 MG tablet Take 1 tablet (25 mg total) by mouth 3 (three) times daily as needed for dizziness. 12/03/16   Arby BarrettePfeiffer, Marcy, MD  methocarbamol (ROBAXIN) 500 MG tablet Take 2 tablets (1,000 mg total) by mouth every 8 (eight) hours as needed for muscle spasms. 10/23/17   Loren RacerYelverton, David, MD  metroNIDAZOLE (FLAGYL) 500 MG tablet Take 1 tablet (500 mg total) by mouth 2 (two) times daily. One po bid x 7 days 12/24/14   Hess, Melina Schoolsobyn M, PA-C  ondansetron (ZOFRAN ODT) 8 MG disintegrating tablet Take 1 tablet (8 mg total) by mouth every 8 (eight) hours as needed for nausea or vomiting. 10/04/15   Linwood DibblesKnapp, Jon, MD  oseltamivir (TAMIFLU)  75 MG capsule Take 1 capsule (75 mg total) by mouth every 12 (twelve) hours. 08/03/18   Gilda CreasePollina, Lamar Naef J, MD  predniSONE (DELTASONE) 50 MG tablet Take 1 tablet (50 mg total) by mouth daily with breakfast. 07/02/16   Lawyer, Cristal Deerhristopher, PA-C  promethazine-dextromethorphan (PROMETHAZINE-DM) 6.25-15 MG/5ML syrup Take 5 mLs by mouth 4 (four) times daily as needed for cough. 07/02/16   Lawyer, Cristal Deerhristopher, PA-C  traMADol (ULTRAM) 50 MG tablet Take 1 tablet (50 mg total) by mouth every 6 (six) hours as needed. 01/29/15   Marlon PelGreene, Tiffany, PA-C    Family History No family history on file.  Social History Social History   Tobacco Use  . Smoking status: Current  Every Day Smoker    Types: Cigarettes  . Smokeless tobacco: Never Used  Substance Use Topics  . Alcohol use: Yes    Comment: occasionaly   . Drug use: No     Allergies   Naproxen   Review of Systems Review of Systems  Constitutional: Positive for chills and fever.  Respiratory: Positive for cough.   Musculoskeletal: Positive for myalgias.  All other systems reviewed and are negative.    Physical Exam Updated Vital Signs BP 119/78 (BP Location: Right Arm)   Pulse 95   Temp (!) 101 F (38.3 C) (Oral)   Resp 19   Ht 5\' 4"  (1.626 m)   Wt 100.2 kg   LMP 07/13/2018   SpO2 100%   BMI 37.93 kg/m   Physical Exam Vitals signs and nursing note reviewed.  Constitutional:      General: She is not in acute distress.    Appearance: Normal appearance. She is well-developed.  HENT:     Head: Normocephalic and atraumatic.     Right Ear: Hearing normal.     Left Ear: Hearing normal.     Nose: Nose normal.  Eyes:     Conjunctiva/sclera: Conjunctivae normal.     Pupils: Pupils are equal, round, and reactive to light.  Neck:     Musculoskeletal: Normal range of motion and neck supple.  Cardiovascular:     Rate and Rhythm: Regular rhythm.     Heart sounds: S1 normal and S2 normal. No murmur. No friction rub. No gallop.   Pulmonary:     Effort: Pulmonary effort is normal. No respiratory distress.     Breath sounds: Normal breath sounds.  Chest:     Chest wall: No tenderness.  Abdominal:     General: Bowel sounds are normal.     Palpations: Abdomen is soft.     Tenderness: There is no abdominal tenderness. There is no guarding or rebound. Negative signs include Murphy's sign and McBurney's sign.     Hernia: No hernia is present.  Musculoskeletal: Normal range of motion.  Skin:    General: Skin is warm and dry.     Findings: No rash.  Neurological:     Mental Status: She is alert and oriented to person, place, and time.     GCS: GCS eye subscore is 4. GCS verbal subscore  is 5. GCS motor subscore is 6.     Cranial Nerves: No cranial nerve deficit.     Sensory: No sensory deficit.     Coordination: Coordination normal.  Psychiatric:        Speech: Speech normal.        Behavior: Behavior normal.        Thought Content: Thought content normal.      ED Treatments /  Results  Labs (all labs ordered are listed, but only abnormal results are displayed) Labs Reviewed - No data to display  EKG None  Radiology No results found.  Procedures Procedures (including critical care time)  Medications Ordered in ED Medications  acetaminophen (TYLENOL) tablet 1,000 mg (has no administration in time range)  oseltamivir (TAMIFLU) capsule 75 mg (has no administration in time range)     Initial Impression / Assessment and Plan / ED Course  I have reviewed the triage vital signs and the nursing notes.  Pertinent labs & imaging results that were available during my care of the patient were reviewed by me and considered in my medical decision making (see chart for details).     Patient presents with fever.  She has had a cough since yesterday and fever developed today.  Lungs are clear, no clinical concern for pneumonia.  No wheezing.  Oxygenation is 100% on room air.  Patient exhibiting a recurrent cough during the exam, otherwise exam unremarkable.  She does not appear toxic.  Treat empirically for upper respiratory infection and cover for influenza which is currently prevalent in the community.  Final Clinical Impressions(s) / ED Diagnoses   Final diagnoses:  Influenza-like illness    ED Discharge Orders         Ordered    oseltamivir (TAMIFLU) 75 MG capsule  Every 12 hours     08/03/18 2340    benzonatate (TESSALON) 200 MG capsule  3 times daily PRN     08/03/18 2340    albuterol (PROVENTIL HFA;VENTOLIN HFA) 108 (90 Base) MCG/ACT inhaler  Every 4 hours PRN     08/03/18 2340           Gilda Crease, MD 08/03/18 2341

## 2019-06-04 ENCOUNTER — Other Ambulatory Visit: Payer: Self-pay

## 2019-06-04 ENCOUNTER — Emergency Department (HOSPITAL_BASED_OUTPATIENT_CLINIC_OR_DEPARTMENT_OTHER): Payer: BC Managed Care – PPO

## 2019-06-04 ENCOUNTER — Emergency Department (HOSPITAL_BASED_OUTPATIENT_CLINIC_OR_DEPARTMENT_OTHER)
Admission: EM | Admit: 2019-06-04 | Discharge: 2019-06-04 | Disposition: A | Discharge status: Home / Self Care | Payer: BC Managed Care – PPO | Attending: Emergency Medicine | Admitting: Emergency Medicine

## 2019-06-04 ENCOUNTER — Encounter (HOSPITAL_BASED_OUTPATIENT_CLINIC_OR_DEPARTMENT_OTHER): Payer: Self-pay | Admitting: *Deleted

## 2019-06-04 DIAGNOSIS — R05 Cough: Secondary | ICD-10-CM | POA: Diagnosis not present

## 2019-06-04 DIAGNOSIS — R5383 Other fatigue: Secondary | ICD-10-CM | POA: Insufficient documentation

## 2019-06-04 DIAGNOSIS — R197 Diarrhea, unspecified: Secondary | ICD-10-CM | POA: Insufficient documentation

## 2019-06-04 DIAGNOSIS — R062 Wheezing: Secondary | ICD-10-CM | POA: Insufficient documentation

## 2019-06-04 DIAGNOSIS — Z20828 Contact with and (suspected) exposure to other viral communicable diseases: Secondary | ICD-10-CM | POA: Insufficient documentation

## 2019-06-04 DIAGNOSIS — R059 Cough, unspecified: Secondary | ICD-10-CM

## 2019-06-04 DIAGNOSIS — Z79899 Other long term (current) drug therapy: Secondary | ICD-10-CM | POA: Diagnosis not present

## 2019-06-04 DIAGNOSIS — Z20822 Contact with and (suspected) exposure to covid-19: Secondary | ICD-10-CM

## 2019-06-04 DIAGNOSIS — F17210 Nicotine dependence, cigarettes, uncomplicated: Secondary | ICD-10-CM | POA: Insufficient documentation

## 2019-06-04 DIAGNOSIS — K0889 Other specified disorders of teeth and supporting structures: Secondary | ICD-10-CM | POA: Diagnosis not present

## 2019-06-04 DIAGNOSIS — R109 Unspecified abdominal pain: Secondary | ICD-10-CM | POA: Insufficient documentation

## 2019-06-04 MED ORDER — BENZONATATE 200 MG PO CAPS: 200.0000 mg | ORAL_CAPSULE | Freq: Three times a day (TID) | ORAL | 0 refills | Status: DC | PRN

## 2019-06-04 MED ORDER — PENICILLIN V POTASSIUM 500 MG PO TABS
500.0000 mg | ORAL_TABLET | Freq: Four times a day (QID) | ORAL | 0 refills | Status: AC
Start: 2019-06-04 — End: 2019-06-11

## 2019-06-04 MED FILL — PENICILLIN VK 500 MG TABLET: 500 | 7 days supply | Qty: 28 | Fill #0

## 2019-06-04 MED FILL — BENZONATATE 200 MG CAP: 200 | 7 days supply | Qty: 20 | Fill #0

## 2019-06-04 NOTE — ED Triage Notes (Signed)
Abdominal pain, diarrhea, fatigue and cough. 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

## 2019-06-04 NOTE — ED Notes (Signed)
Pt verbalized understanding of dc instructions.

## 2019-06-04 NOTE — ED Provider Notes (Signed)
[facility] EMERGENCY DEPARTMENT Provider Note   CSN: [id number] Arrival date & time: 06/04/19  1423     History   Chief Complaint Chief Complaint  Patient presents with  . Covid Symptoms    HPI Bridget Griffin is a 33 y.o. female with history of bronchitis who presents with a 1 week history of fatigue, cough, wheezing.  Patient reports having some abdominal cramping and diarrhea a few times yesterday, however that has not recurred.  She was around a coworker that tested positive for Covid last week.  Patient denies any documented fever at home.  She denies any chest pain, significant shortness of breath, nausea, vomiting, loss of taste or smell.  She has been taking Mucinex and albuterol inhaler at home with some relief.  Patient also reports that her right upper wisdom tooth is coming in it has been causing her pain.  She describes the pain is worse in the morning.  She is planning to follow-up with a dentist soon.     HPI  Past Medical History:  Diagnosis Date  . Bronchitis     There are no active problems to display for this patient.   Past Surgical History:  Procedure Laterality Date  . CYSTECTOMY    . SKIN BIOPSY       OB History   No obstetric history on file.      Home Medications    Prior to Admission medications   Medication Sig Start Date End Date Taking? Authorizing Provider  albuterol (PROVENTIL HFA;VENTOLIN HFA) 108 (90 Base) MCG/ACT inhaler Inhale 2 puffs into the lungs every 4 (four) hours as needed for wheezing or shortness of breath. 08/03/18  Yes Pollina, Gwenyth Allegra, MD  acetaminophen-codeine 120-12 MG/5ML solution Take 10 mLs by mouth every 4 (four) hours as needed for moderate pain. 10/28/15   Lawyer, Harrell Gave, PA-C  azithromycin (ZITHROMAX) 250 MG tablet Take 1 tablet (250 mg total) by mouth daily. Take first 2 tablets together, then 1 every day until finished. 09/25/12   Fransico Meadow, PA-C  benzonatate (TESSALON) 200 MG capsule  Take 1 capsule (200 mg total) by mouth 3 (three) times daily as needed for cough. 06/04/19   Damiya Sandefur, Bea Graff, PA-C  cephALEXin (KEFLEX) 500 MG capsule Take 1 capsule (500 mg total) by mouth 4 (four) times daily. 12/24/14   Hess, Hessie Diener, PA-C  ciprofloxacin (CIPRO) 250 MG tablet Take 1 tablet (250 mg total) by mouth every 12 (twelve) hours. 01/28/14   Pamella Pert, MD  diazepam (VALIUM) 5 MG tablet Take 1 tablet (5 mg total) by mouth every 6 (six) hours as needed for muscle spasms. 12/16/15   Harvel Quale, MD  Guaifenesin 1200 MG TB12 Take 1 tablet (1,200 mg total) by mouth 2 (two) times daily. 07/02/16   Lawyer, Harrell Gave, PA-C  ibuprofen (ADVIL,MOTRIN) 600 MG tablet Take 1 tablet (600 mg total) by mouth every 6 (six) hours as needed for moderate pain. 12/16/15   Harvel Quale, MD  meclizine (ANTIVERT) 25 MG tablet Take 1 tablet (25 mg total) by mouth 3 (three) times daily as needed for dizziness. 12/03/16   Charlesetta Shanks, MD  methocarbamol (ROBAXIN) 500 MG tablet Take 2 tablets (1,000 mg total) by mouth every 8 (eight) hours as needed for muscle spasms. 10/23/17   Julianne Rice, MD  metroNIDAZOLE (FLAGYL) 500 MG tablet Take 1 tablet (500 mg total) by mouth 2 (two) times daily. One po bid x 7 days 12/24/14   Lucien Mons  M, PA-C  ondansetron (ZOFRAN ODT) 8 MG disintegrating tablet Take 1 tablet (8 mg total) by mouth every 8 (eight) hours as needed for nausea or vomiting. 10/04/15   Linwood Dibbles, MD  oseltamivir (TAMIFLU) 75 MG capsule Take 1 capsule (75 mg total) by mouth every 12 (twelve) hours. 08/03/18   Gilda Crease, MD  penicillin v potassium (VEETID) 500 MG tablet Take 1 tablet (500 mg total) by mouth 4 (four) times daily for 7 days. 06/04/19 06/11/19  Emi Holes, PA-C  predniSONE (DELTASONE) 50 MG tablet Take 1 tablet (50 mg total) by mouth daily with breakfast. 07/02/16   Lawyer, Cristal Deer, PA-C  promethazine-dextromethorphan (PROMETHAZINE-DM) 6.25-15 MG/5ML syrup Take 5  mLs by mouth 4 (four) times daily as needed for cough. 07/02/16   Lawyer, Cristal Deer, PA-C  traMADol (ULTRAM) 50 MG tablet Take 1 tablet (50 mg total) by mouth every 6 (six) hours as needed. 01/29/15   Marlon Pel, PA-C    Family History No family history on file.  Social History Social History   Tobacco Use  . Smoking status: Current Every Day Smoker    Types: Cigarettes  . Smokeless tobacco: Never Used  Substance Use Topics  . Alcohol use: Yes    Comment: occasionaly   . Drug use: No     Allergies   Naproxen   Review of Systems Review of Systems  Constitutional: Negative for chills and fever.  HENT: Positive for dental problem. Negative for facial swelling and sore throat.   Respiratory: Positive for cough and wheezing. Negative for shortness of breath.   Cardiovascular: Negative for chest pain.  Gastrointestinal: Positive for abdominal pain (resolved) and diarrhea. Negative for nausea and vomiting.  Genitourinary: Negative for dysuria.  Musculoskeletal: Negative for back pain.  Skin: Negative for rash and wound.  Neurological: Negative for headaches.  Psychiatric/Behavioral: The patient is not nervous/anxious.      Physical Exam Updated Vital Signs BP (!) 140/99   Pulse 70   Temp 98.9 F (37.2 C) (Oral)   Resp 18   Ht 5\' 4"  (1.626 m)   Wt 99.8 kg   LMP 05/07/2019   SpO2 100%   BMI 37.76 kg/m   Physical Exam Vitals signs and nursing note reviewed.  Constitutional:      General: She is not in acute distress.    Appearance: She is well-developed. She is not diaphoretic.  HENT:     Head: Normocephalic and atraumatic.     Mouth/Throat:     Pharynx: No oropharyngeal exudate.   Eyes:     General: No scleral icterus.       Right eye: No discharge.        Left eye: No discharge.     Conjunctiva/sclera: Conjunctivae normal.     Pupils: Pupils are equal, round, and reactive to light.  Neck:     Musculoskeletal: Normal range of motion and neck supple.      Thyroid: No thyromegaly.  Cardiovascular:     Rate and Rhythm: Normal rate and regular rhythm.     Heart sounds: Normal heart sounds. No murmur. No friction rub. No gallop.   Pulmonary:     Effort: Pulmonary effort is normal. No respiratory distress.     Breath sounds: No stridor. Wheezing (R upper lung) present. No rales.  Abdominal:     General: Bowel sounds are normal. There is no distension.     Palpations: Abdomen is soft.     Tenderness: There is no abdominal  tenderness. There is no guarding or rebound.  Lymphadenopathy:     Cervical: No cervical adenopathy.  Skin:    General: Skin is warm and dry.     Coloration: Skin is not pale.     Findings: No rash.  Neurological:     Mental Status: She is alert.     Coordination: Coordination normal.      ED Treatments / Results  Labs (all labs ordered are listed, but only abnormal results are displayed) Labs Reviewed  NOVEL CORONAVIRUS, NAA (HOSP ORDER, SEND-OUT TO REF LAB; TAT 18-24 HRS)    EKG None  Radiology Dg Chest Portable 1 View  Result Date: 06/04/2019 CLINICAL DATA:  Cough, wheezing, mild shortness of breath. EXAM: PORTABLE CHEST 1 VIEW COMPARISON:  10/23/2017 FINDINGS: The cardiomediastinal contours are normal. The lungs are clear. Pulmonary vasculature is normal. No consolidation, pleural effusion, or pneumothorax. No acute osseous abnormalities are seen. IMPRESSION: Unremarkable portable chest radiograph. Electronically Signed   By: Narda RutherfordMelanie  Sanford M.D.   On: 06/04/2019 17:12    Procedures Procedures (including critical care time)  Medications Ordered in ED Medications - No data to display   Initial Impression / Assessment and Plan / ED Course  I have reviewed the triage vital signs and the nursing notes.  Pertinent labs & imaging results that were available during my care of the patient were reviewed by me and considered in my medical decision making (see chart for details).        Patient  presenting with fatigue, cough, few episodes of diarrhea yesterday.  She had a known Covid exposure.  Chest x-ray is clear.  Will treat suspected viral infection supportively, possibly Covid 19.  COVID-19 test pending.  Will cover dental pain with penicillin.  Quarantine and isolation discussed.  Follow-up to dentist as soon as possible.  Return precautions discussed.  Patient vitals stable throughout ED course and discharged in satisfactory condition.  Cheral MarkerShenicka Aken was evaluated in Emergency Department on 06/04/2019 for the symptoms described in the history of present illness. She was evaluated in the context of the global COVID-19 pandemic, which necessitated consideration that the patient might be at risk for infection with the SARS-CoV-2 virus that causes COVID-19. Institutional protocols and algorithms that pertain to the evaluation of patients at risk for COVID-19 are in a state of rapid change based on information released by regulatory bodies including the CDC and federal and state organizations. These policies and algorithms were followed during the patient's care in the ED.   Final Clinical Impressions(s) / ED Diagnoses   Final diagnoses:  Suspected COVID-19 virus infection  Cough  Pain, dental    ED Discharge Orders         Ordered    penicillin v potassium (VEETID) 500 MG tablet  4 times daily     06/04/19 1731    benzonatate (TESSALON) 200 MG capsule  3 times daily PRN     06/04/19 1731           Emi HolesLaw, Allister Lessley M, PA-C 06/04/19 1736    Tegeler, Canary Brimhristopher J, MD 06/04/19 2047

## 2019-06-04 NOTE — Discharge Instructions (Addendum)
Your chest x-ray was clear today.  Take penicillin as prescribed to cover for possible infection causing your dental pain.  Take Tessalon every 8 hours as needed for cough.  Please stay isolated at home for 10 days and until your symptoms are resolving for 3 days.  Please return to the emergency department if you develop any new or worsening symptoms.  You will be called in 1 to 2 days only if you test positive for Covid-19.

## 2019-06-05 LAB — NOVEL CORONAVIRUS, NAA (HOSP ORDER, SEND-OUT TO REF LAB; TAT 18-24 HRS): SARS-CoV-2, NAA: NOT DETECTED

## 2019-06-11 ENCOUNTER — Telehealth (HOSPITAL_COMMUNITY): Payer: Self-pay

## 2019-10-01 ENCOUNTER — Other Ambulatory Visit: Payer: Self-pay

## 2019-10-01 ENCOUNTER — Emergency Department (HOSPITAL_BASED_OUTPATIENT_CLINIC_OR_DEPARTMENT_OTHER)
Admission: EM | Admit: 2019-10-01 | Discharge: 2019-10-01 | Disposition: A | Discharge status: Home / Self Care | Payer: BC Managed Care – PPO | Attending: Emergency Medicine | Admitting: Emergency Medicine

## 2019-10-01 DIAGNOSIS — Y93G1 Activity, food preparation and clean up: Secondary | ICD-10-CM | POA: Diagnosis not present

## 2019-10-01 DIAGNOSIS — Z79899 Other long term (current) drug therapy: Secondary | ICD-10-CM | POA: Insufficient documentation

## 2019-10-01 DIAGNOSIS — W260XXA Contact with knife, initial encounter: Secondary | ICD-10-CM | POA: Diagnosis not present

## 2019-10-01 DIAGNOSIS — Y9289 Other specified places as the place of occurrence of the external cause: Secondary | ICD-10-CM | POA: Insufficient documentation

## 2019-10-01 DIAGNOSIS — F17210 Nicotine dependence, cigarettes, uncomplicated: Secondary | ICD-10-CM | POA: Insufficient documentation

## 2019-10-01 DIAGNOSIS — S61012A Laceration without foreign body of left thumb without damage to nail, initial encounter: Secondary | ICD-10-CM

## 2019-10-01 DIAGNOSIS — Y999 Unspecified external cause status: Secondary | ICD-10-CM | POA: Insufficient documentation

## 2019-10-01 NOTE — ED Provider Notes (Signed)
[facility] EMERGENCY DEPARTMENT Provider Note   CSN: [id number] Arrival date & time: 10/01/19  1538     History Chief Complaint  Patient presents with  . Laceration    Bridget Griffin is a 34 y.o. female.  34 y.o female with no PMH presents to the ED with a chief complaint of left thumb laceration. Patient reports she was washing dishes when a knife slipped cutting the tip of her left thumb off.   The history is provided by the patient.  Laceration Associated symptoms: no fever        Past Medical History:  Diagnosis Date  . Bronchitis     There are no problems to display for this patient.   Past Surgical History:  Procedure Laterality Date  . CYSTECTOMY    . SKIN BIOPSY       OB History   No obstetric history on file.     No family history on file.  Social History   Tobacco Use  . Smoking status: Current Every Day Smoker    Types: Cigarettes  . Smokeless tobacco: Never Used  Substance Use Topics  . Alcohol use: Yes    Comment: occasionaly   . Drug use: No    Home Medications Prior to Admission medications   Medication Sig Start Date End Date Taking? Authorizing Provider  acetaminophen-codeine 120-12 MG/5ML solution Take 10 mLs by mouth every 4 (four) hours as needed for moderate pain. 10/28/15   Lawyer, Cristal Deer, PA-C  albuterol (PROVENTIL HFA;VENTOLIN HFA) 108 (90 Base) MCG/ACT inhaler Inhale 2 puffs into the lungs every 4 (four) hours as needed for wheezing or shortness of breath. 08/03/18   Gilda Crease, MD  azithromycin (ZITHROMAX) 250 MG tablet Take 1 tablet (250 mg total) by mouth daily. Take first 2 tablets together, then 1 every day until finished. 09/25/12   Elson Areas, PA-C  benzonatate (TESSALON) 200 MG capsule Take 1 capsule (200 mg total) by mouth 3 (three) times daily as needed for cough. 06/04/19   Law, Waylan Boga, PA-C  cephALEXin (KEFLEX) 500 MG capsule Take 1 capsule (500 mg total) by mouth 4 (four) times  daily. 12/24/14   Hess, Nada Boozer, PA-C  ciprofloxacin (CIPRO) 250 MG tablet Take 1 tablet (250 mg total) by mouth every 12 (twelve) hours. 01/28/14   Purvis Sheffield, MD  diazepam (VALIUM) 5 MG tablet Take 1 tablet (5 mg total) by mouth every 6 (six) hours as needed for muscle spasms. 12/16/15   Leta Baptist, MD  Guaifenesin 1200 MG TB12 Take 1 tablet (1,200 mg total) by mouth 2 (two) times daily. 07/02/16   Lawyer, Cristal Deer, PA-C  ibuprofen (ADVIL,MOTRIN) 600 MG tablet Take 1 tablet (600 mg total) by mouth every 6 (six) hours as needed for moderate pain. 12/16/15   Leta Baptist, MD  meclizine (ANTIVERT) 25 MG tablet Take 1 tablet (25 mg total) by mouth 3 (three) times daily as needed for dizziness. 12/03/16   Arby Barrette, MD  methocarbamol (ROBAXIN) 500 MG tablet Take 2 tablets (1,000 mg total) by mouth every 8 (eight) hours as needed for muscle spasms. 10/23/17   Loren Racer, MD  metroNIDAZOLE (FLAGYL) 500 MG tablet Take 1 tablet (500 mg total) by mouth 2 (two) times daily. One po bid x 7 days 12/24/14   Hess, Melina Schools M, PA-C  ondansetron (ZOFRAN ODT) 8 MG disintegrating tablet Take 1 tablet (8 mg total) by mouth every 8 (eight) hours as needed for nausea or vomiting.  10/04/15   Linwood Dibbles, MD  oseltamivir (TAMIFLU) 75 MG capsule Take 1 capsule (75 mg total) by mouth every 12 (twelve) hours. 08/03/18   Gilda Crease, MD  predniSONE (DELTASONE) 50 MG tablet Take 1 tablet (50 mg total) by mouth daily with breakfast. 07/02/16   Lawyer, Cristal Deer, PA-C  promethazine-dextromethorphan (PROMETHAZINE-DM) 6.25-15 MG/5ML syrup Take 5 mLs by mouth 4 (four) times daily as needed for cough. 07/02/16   Lawyer, Cristal Deer, PA-C  traMADol (ULTRAM) 50 MG tablet Take 1 tablet (50 mg total) by mouth every 6 (six) hours as needed. 01/29/15   Marlon Pel, PA-C    Allergies    Naproxen  Review of Systems   Review of Systems  Constitutional: Negative for fever.  Respiratory: Negative for  shortness of breath.   Skin: Positive for wound.    Physical Exam Updated Vital Signs BP (!) 133/97 (BP Location: Left Arm)   Pulse 74   Temp 98.6 F (37 C) (Oral)   Resp 18   Ht 5\' 4"  (1.626 m)   Wt 98.9 kg   LMP 09/30/2019 (Exact Date)   SpO2 100%   BMI 37.42 kg/m   Physical Exam Vitals and nursing note reviewed.  Constitutional:      Appearance: Normal appearance.  HENT:     Head: Normocephalic and atraumatic.     Mouth/Throat:     Mouth: Mucous membranes are moist.  Eyes:     Pupils: Pupils are equal, round, and reactive to light.  Cardiovascular:     Rate and Rhythm: Normal rate.  Abdominal:     General: Abdomen is flat.     Tenderness: There is no abdominal tenderness. There is no right CVA tenderness or left CVA tenderness.  Musculoskeletal:     Left hand: Laceration present. No bony tenderness. Normal sensation. There is no disruption of two-point discrimination. Normal capillary refill. Normal pulse.     Cervical back: Normal range of motion and neck supple.     Comments: Pulses present, capillary refill, laceration noted to the tip of her finger, bleeding controlled,superficial in nature.   Skin:    General: Skin is warm and dry.  Neurological:     Mental Status: She is alert and oriented to person, place, and time.     ED Results / Procedures / Treatments   Labs (all labs ordered are listed, but only abnormal results are displayed) Labs Reviewed - No data to display  EKG None  Radiology No results found.  Procedures Procedures (including critical care time)  Medications Ordered in ED Medications - No data to display  ED Course  I have reviewed the triage vital signs and the nursing notes.  Pertinent labs & imaging results that were available during my care of the patient were reviewed by me and considered in my medical decision making (see chart for details).    MDM Rules/Calculators/A&P  Patient with no pertinent past medical history  presents to the ED status post laceration while cleaning her knives at home.  Bruising is controlled.Last tetanus vaccine is unknown but there was no penetration to the skin, aside from superficial removal of the callus distal aspect.   Exam is unremarkable, there is superficial laceration noted to the left thumb, removed skin by me.  Wound is now exposed, no need for surgical repair at this time.  We discussed Dermabond, patient is opting out both keeping wound addressed.  She currently works at 10/02/2019, encouraged to keep his wound covered while at  work on allow for air dry while at home.  He was provided with dressings in order to change her bandages at home.  Return precautions were discussed at length.  Portions of this note were generated with [employer]. Dictation errors may occur despite best attempts at proofreading.  Final Clinical Impression(s) / ED Diagnoses Final diagnoses:  Laceration of left thumb without foreign body without damage to nail, initial encounter    Rx / DC Orders ED Discharge Orders    None       Janeece Fitting, PA-C 10/01/19 1643    Long, Wonda Olds, MD 10/02/19 2311

## 2019-10-01 NOTE — ED Triage Notes (Addendum)
Pt c/o laceration to left thumb tip 20 mins pta. Bleeding controlled

## 2019-10-01 NOTE — Discharge Instructions (Addendum)
Please keep wound clean and dry. Removed dressing within 24 hours.  Keep the wound covered while at work but allow wound to dry while at home.   You may apply bacitracin or neosporin to the area.

## 2020-01-15 ENCOUNTER — Encounter (HOSPITAL_BASED_OUTPATIENT_CLINIC_OR_DEPARTMENT_OTHER): Payer: Self-pay

## 2020-01-15 ENCOUNTER — Other Ambulatory Visit: Payer: Self-pay

## 2020-01-15 ENCOUNTER — Emergency Department (HOSPITAL_BASED_OUTPATIENT_CLINIC_OR_DEPARTMENT_OTHER)
Admission: EM | Admit: 2020-01-15 | Discharge: 2020-01-15 | Disposition: A | Discharge status: Home / Self Care | Payer: BC Managed Care – PPO | Attending: Emergency Medicine | Admitting: Emergency Medicine

## 2020-01-15 DIAGNOSIS — F17210 Nicotine dependence, cigarettes, uncomplicated: Secondary | ICD-10-CM | POA: Insufficient documentation

## 2020-01-15 DIAGNOSIS — L03211 Cellulitis of face: Secondary | ICD-10-CM | POA: Insufficient documentation

## 2020-01-15 DIAGNOSIS — R220 Localized swelling, mass and lump, head: Secondary | ICD-10-CM | POA: Diagnosis present

## 2020-01-15 MED ORDER — DOXYCYCLINE HYCLATE 100 MG PO CAPS: 100.0000 mg | ORAL_CAPSULE | Freq: Two times a day (BID) | ORAL | 0 refills | Status: AC

## 2020-01-15 MED ORDER — FLUCONAZOLE 200 MG PO TABS: 200.0000 mg | ORAL_TABLET | Freq: Once | ORAL | 0 refills | Status: AC

## 2020-01-15 NOTE — Discharge Instructions (Addendum)
Take antibiotics as prescribed.  Take the entire course, even if your symptoms improve. Use Tylenol and ibuprofen as needed for pain. Use warm compresses 3 times a day to help with pain and swelling.  Otherwise, try not to touch, itch, or poke at the area. Take the Diflucan after finishing the antibiotics as needed for yeast infection. Follow-up with your primary care doctor symptoms are not improving. Return to the emergency room with any new, worsening, concerning symptoms.

## 2020-01-15 NOTE — ED Provider Notes (Signed)
[facility] EMERGENCY DEPARTMENT Provider Note   CSN: [id number] Arrival date & time: 01/15/20  1849     History No chief complaint on file.   Bridget Griffin is a 34 y.o. female presenting for evaluation of pain and swelling of the chin.  Patient states yesterday she had a small white spot on her chin that looked like a pimple.  Over the past 24 hours, has become more painful and swollen.  She reports clear drainage from the area today.  She used warm compress, but this did not help her symptoms.  She denies fevers or chills.  She is worried she was bitten by spider.  She denies itching.  She denies lesions elsewhere.  She has no other medical problems, takes no medications daily.  HPI     Past Medical History:  Diagnosis Date  . Bronchitis     There are no problems to display for this patient.   Past Surgical History:  Procedure Laterality Date  . CYSTECTOMY    . SKIN BIOPSY       OB History   No obstetric history on file.     History reviewed. No pertinent family history.  Social History   Tobacco Use  . Smoking status: Current Every Day Smoker    Types: Cigarettes  . Smokeless tobacco: Never Used  Substance Use Topics  . Alcohol use: Yes    Comment: occasionaly   . Drug use: No    Home Medications Prior to Admission medications   Medication Sig Start Date End Date Taking? Authorizing Provider  acetaminophen-codeine 120-12 MG/5ML solution Take 10 mLs by mouth every 4 (four) hours as needed for moderate pain. 10/28/15   Lawyer, Cristal Deer, PA-C  albuterol (PROVENTIL HFA;VENTOLIN HFA) 108 (90 Base) MCG/ACT inhaler Inhale 2 puffs into the lungs every 4 (four) hours as needed for wheezing or shortness of breath. 08/03/18   Gilda Crease, MD  azithromycin (ZITHROMAX) 250 MG tablet Take 1 tablet (250 mg total) by mouth daily. Take first 2 tablets together, then 1 every day until finished. 09/25/12   Elson Areas, PA-C  benzonatate (TESSALON)  200 MG capsule Take 1 capsule (200 mg total) by mouth 3 (three) times daily as needed for cough. 06/04/19   Law, Waylan Boga, PA-C  cephALEXin (KEFLEX) 500 MG capsule Take 1 capsule (500 mg total) by mouth 4 (four) times daily. 12/24/14   Hess, Nada Boozer, PA-C  ciprofloxacin (CIPRO) 250 MG tablet Take 1 tablet (250 mg total) by mouth every 12 (twelve) hours. 01/28/14   Purvis Sheffield, MD  diazepam (VALIUM) 5 MG tablet Take 1 tablet (5 mg total) by mouth every 6 (six) hours as needed for muscle spasms. 12/16/15   Leta Baptist, MD  doxycycline (VIBRAMYCIN) 100 MG capsule Take 1 capsule (100 mg total) by mouth 2 (two) times daily for 7 days. 01/15/20 01/22/20  Rhaelyn Giron, PA-C  fluconazole (DIFLUCAN) 200 MG tablet Take 1 tablet (200 mg total) by mouth once for 1 dose. 01/15/20 01/15/20  Aya Geisel, PA-C  Guaifenesin 1200 MG TB12 Take 1 tablet (1,200 mg total) by mouth 2 (two) times daily. 07/02/16   Lawyer, Cristal Deer, PA-C  ibuprofen (ADVIL,MOTRIN) 600 MG tablet Take 1 tablet (600 mg total) by mouth every 6 (six) hours as needed for moderate pain. 12/16/15   Leta Baptist, MD  meclizine (ANTIVERT) 25 MG tablet Take 1 tablet (25 mg total) by mouth 3 (three) times daily as needed for dizziness. 12/03/16  Arby Barrette, MD  methocarbamol (ROBAXIN) 500 MG tablet Take 2 tablets (1,000 mg total) by mouth every 8 (eight) hours as needed for muscle spasms. 10/23/17   Loren Racer, MD  metroNIDAZOLE (FLAGYL) 500 MG tablet Take 1 tablet (500 mg total) by mouth 2 (two) times daily. One po bid x 7 days 12/24/14   Hess, Melina Schools M, PA-C  ondansetron (ZOFRAN ODT) 8 MG disintegrating tablet Take 1 tablet (8 mg total) by mouth every 8 (eight) hours as needed for nausea or vomiting. 10/04/15   Linwood Dibbles, MD  oseltamivir (TAMIFLU) 75 MG capsule Take 1 capsule (75 mg total) by mouth every 12 (twelve) hours. 08/03/18   Gilda Crease, MD  predniSONE (DELTASONE) 50 MG tablet Take 1 tablet (50 mg total)  by mouth daily with breakfast. 07/02/16   Lawyer, Cristal Deer, PA-C  promethazine-dextromethorphan (PROMETHAZINE-DM) 6.25-15 MG/5ML syrup Take 5 mLs by mouth 4 (four) times daily as needed for cough. 07/02/16   Lawyer, Cristal Deer, PA-C  traMADol (ULTRAM) 50 MG tablet Take 1 tablet (50 mg total) by mouth every 6 (six) hours as needed. 01/29/15   Marlon Pel, PA-C    Allergies    Naproxen  Review of Systems   Review of Systems  Constitutional: Negative for fever.  Skin: Positive for rash.    Physical Exam Updated Vital Signs BP 128/88 (BP Location: Left Arm)   Pulse 86   Temp 98.9 F (37.2 C) (Oral)   Resp 19   Ht 5\' 4"  (1.626 m)   Wt 101.2 kg   SpO2 99%   BMI 38.28 kg/m   Physical Exam Vitals and nursing note reviewed.  Constitutional:      General: She is not in acute distress.    Appearance: She is well-developed.     Comments: Sitting comfortably in the bed in no acute distress  HENT:     Head: Normocephalic and atraumatic.      Comments: 1.5 cm area of swelling and tenderness of the chin.  Active drainage.  No fluctuance.  No streaking.  No pain under the chin.  No difficulty opening the mouth.  No pain under the tongue.  No dental pain. Pulmonary:     Effort: Pulmonary effort is normal.  Abdominal:     General: There is no distension.  Musculoskeletal:        General: Normal range of motion.     Cervical back: Normal range of motion.  Skin:    General: Skin is warm.     Findings: No rash.  Neurological:     Mental Status: She is alert and oriented to person, place, and time.     ED Results / Procedures / Treatments   Labs (all labs ordered are listed, but only abnormal results are displayed) Labs Reviewed - No data to display  EKG None  Radiology No results found.  Procedures Procedures (including critical care time)  Medications Ordered in ED Medications - No data to display  ED Course  I have reviewed the triage vital signs and the  nursing notes.  Pertinent labs & imaging results that were available during my care of the patient were reviewed by me and considered in my medical decision making (see chart for details).    MDM Rules/Calculators/A&P                          Presenting for evaluation of chin tenderness and swelling.  On exam, patient appears nontoxic.  Exam is consistent with cellulitis/inflammation of the extremity.  It began as a small pimple, which includes her margin.  Consider early abscess.  However without a large fluctuant area, will not I&D on the face at this time, as I feel risks for scarring outweighs benefit.  Discussed conservative treatment with antibiotics and warm compresses.  At this time, patient received a discharge.  Return precautions given.  Patient states she understands and agrees to plan.  Final Clinical Impression(s) / ED Diagnoses Final diagnoses:  Cellulitis of chin    Rx / DC Orders ED Discharge Orders         Ordered    doxycycline (VIBRAMYCIN) 100 MG capsule  2 times daily     Discontinue  Reprint     01/15/20 2044    fluconazole (DIFLUCAN) 200 MG tablet   Once     Discontinue  Reprint     01/15/20 2044           Alveria Apley, PA-C 01/15/20 2310    Geoffery Lyons, MD 01/15/20 2359

## 2020-01-15 NOTE — ED Triage Notes (Signed)
Pt has suspected spider bite on chin x 2 days, woke up today & it was more swollen, hard, and tender to touch.

## 2020-11-06 ENCOUNTER — Emergency Department (HOSPITAL_BASED_OUTPATIENT_CLINIC_OR_DEPARTMENT_OTHER)
Admission: EM | Admit: 2020-11-06 | Discharge: 2020-11-06 | Disposition: A | Discharge status: Home / Self Care | Payer: BC Managed Care – PPO | Attending: Emergency Medicine | Admitting: Emergency Medicine

## 2020-11-06 ENCOUNTER — Emergency Department (HOSPITAL_BASED_OUTPATIENT_CLINIC_OR_DEPARTMENT_OTHER): Payer: BC Managed Care – PPO

## 2020-11-06 ENCOUNTER — Other Ambulatory Visit: Payer: Self-pay

## 2020-11-06 DIAGNOSIS — K8020 Calculus of gallbladder without cholecystitis without obstruction: Secondary | ICD-10-CM

## 2020-11-06 DIAGNOSIS — D72829 Elevated white blood cell count, unspecified: Secondary | ICD-10-CM | POA: Diagnosis not present

## 2020-11-06 DIAGNOSIS — R1011 Right upper quadrant pain: Secondary | ICD-10-CM | POA: Diagnosis present

## 2020-11-06 DIAGNOSIS — F17210 Nicotine dependence, cigarettes, uncomplicated: Secondary | ICD-10-CM | POA: Diagnosis not present

## 2020-11-06 LAB — URINALYSIS, ROUTINE W REFLEX MICROSCOPIC
Bilirubin Urine: NEGATIVE
Glucose, UA: NEGATIVE mg/dL
Ketones, ur: NEGATIVE mg/dL
Nitrite: NEGATIVE
Protein, ur: NEGATIVE mg/dL
Specific Gravity, Urine: 1.025 (ref 1.005–1.030)
pH: 6 (ref 5.0–8.0)

## 2020-11-06 LAB — CBC WITH DIFFERENTIAL/PLATELET
Abs Immature Granulocytes: 0.04 10*3/uL (ref 0.00–0.07)
Basophils Absolute: 0 10*3/uL (ref 0.0–0.1)
Basophils Relative: 0 %
Eosinophils Absolute: 0.1 10*3/uL (ref 0.0–0.5)
Eosinophils Relative: 0 %
HCT: 40.7 % (ref 36.0–46.0)
Hemoglobin: 12.8 g/dL (ref 12.0–15.0)
Immature Granulocytes: 0 %
Lymphocytes Relative: 23 %
Lymphs Abs: 2.6 10*3/uL (ref 0.7–4.0)
MCH: 26.2 pg (ref 26.0–34.0)
MCHC: 31.4 g/dL (ref 30.0–36.0)
MCV: 83.2 fL (ref 80.0–100.0)
Monocytes Absolute: 0.7 10*3/uL (ref 0.1–1.0)
Monocytes Relative: 6 %
Neutro Abs: 7.9 10*3/uL — ABNORMAL HIGH (ref 1.7–7.7)
Neutrophils Relative %: 71 %
Platelets: 366 10*3/uL (ref 150–400)
RBC: 4.89 MIL/uL (ref 3.87–5.11)
RDW: 14.4 % (ref 11.5–15.5)
WBC: 11.2 10*3/uL — ABNORMAL HIGH (ref 4.0–10.5)
nRBC: 0 % (ref 0.0–0.2)

## 2020-11-06 LAB — HCG, SERUM, QUALITATIVE: Preg, Serum: NEGATIVE

## 2020-11-06 LAB — COMPREHENSIVE METABOLIC PANEL
ALT: 31 U/L (ref 0–44)
AST: 25 U/L (ref 15–41)
Albumin: 4.4 g/dL (ref 3.5–5.0)
Alkaline Phosphatase: 65 U/L (ref 38–126)
Anion gap: 10 (ref 5–15)
BUN: 9 mg/dL (ref 6–20)
CO2: 24 mmol/L (ref 22–32)
Calcium: 9.2 mg/dL (ref 8.9–10.3)
Chloride: 102 mmol/L (ref 98–111)
Creatinine, Ser: 1.07 mg/dL — ABNORMAL HIGH (ref 0.44–1.00)
GFR, Estimated: >60 mL/min (ref >60)
Glucose, Bld: 100 mg/dL — ABNORMAL HIGH (ref 70–99)
Potassium: 3.7 mmol/L (ref 3.5–5.1)
Sodium: 136 mmol/L (ref 135–145)
Total Bilirubin: 0.4 mg/dL (ref 0.3–1.2)
Total Protein: 8 g/dL (ref 6.5–8.1)

## 2020-11-06 LAB — URINALYSIS, MICROSCOPIC (REFLEX)

## 2020-11-06 LAB — LIPASE, BLOOD: Lipase: 26 U/L (ref 11–51)

## 2020-11-06 MED ORDER — FENTANYL CITRATE (PF) 100 MCG/2ML IJ SOLN
50.0000 ug | Freq: Once | INTRAMUSCULAR | Status: AC
  Administered 2020-11-06: 50 ug via INTRAVENOUS
  Filled 2020-11-06: qty 2

## 2020-11-06 MED ORDER — ONDANSETRON HCL 4 MG/2ML IJ SOLN
4.0000 mg | Freq: Once | INTRAMUSCULAR | Status: AC
  Administered 2020-11-06: 4 mg via INTRAVENOUS
  Filled 2020-11-06: qty 2

## 2020-11-06 MED ORDER — ONDANSETRON 4 MG PO TBDP: 4.0000 mg | ORAL_TABLET | Freq: Three times a day (TID) | ORAL | 0 refills | Status: AC | PRN

## 2020-11-06 MED ORDER — OXYCODONE-ACETAMINOPHEN 5-325 MG PO TABS
1.0000 | ORAL_TABLET | Freq: Four times a day (QID) | ORAL | 0 refills | Status: AC | PRN
Start: 2020-11-06 — End: ?

## 2020-11-06 NOTE — Discharge Instructions (Signed)
You are seen in the emergency room today for abdominal pain.  Your blood work and physical exam were very reassuring.  There are no signs of infection.  Additionally your ultrasound showed gallstones in your gallbladder, however there is no sign of infection that require emergent removal of her gallbladder.  Below is the contact information for Dr. Magnus Ivan, general surgeon with [facility] surgery, whom you may follow-up with in the next week for further evaluation.    You have been prescribed nausea medication and pain medication, take as needed for your symptoms at home.  You may also utilize ibuprofen as needed  Please return to the emergency department if you develop any worsening abdominal pain, nausea/vomiting that does not stop, fevers greater than 100.4 F or any other severe symptoms.

## 2020-11-06 NOTE — ED Provider Notes (Signed)
[facility] EMERGENCY DEPARTMENT Provider Note   CSN: [id number] Arrival date & time: 11/06/20  1037     History Chief Complaint  Patient presents with  . Abdominal Pain  . Emesis    Bridget Griffin is a 35 y.o. female who presents with concern for approximately 3 weeks of intermittent right upper quadrant pain with associated nausea.  Patient was seen by gastroenterology and has an ultrasound scheduled for the right upper abdomen for next week.  She is started on pantoprazole which she has not yet started, per GI recommendations, as they are waiting her stool sample specimen.  She states that today she was at work and she began having worsening right upper quadrant pain and had multiple episodes of NBNB emesis.  She was brought to the emergency department by her colleagues.  She denies any fevers or chills at home, denies any diarrhea.  I personally read this patient's medical records.  She does not carry any medical diagnoses aside from right upper quadrant pain, and is not on any medications every day.  HPI     Past Medical History:  Diagnosis Date  . Bronchitis     There are no problems to display for this patient.   Past Surgical History:  Procedure Laterality Date  . CYSTECTOMY    . SKIN BIOPSY       OB History   No obstetric history on file.     No family history on file.  Social History   Tobacco Use  . Smoking status: Current Every Day Smoker    Types: Cigarettes  . Smokeless tobacco: Never Used  Substance Use Topics  . Alcohol use: Yes    Comment: occasionaly   . Drug use: No    Home Medications Prior to Admission medications   Medication Sig Start Date End Date Taking? Authorizing Provider  ondansetron (ZOFRAN ODT) 4 MG disintegrating tablet Take 1 tablet (4 mg total) by mouth every 8 (eight) hours as needed for nausea or vomiting. 11/06/20  Yes Ashraf Mesta, Eugene Gaviaebekah R, PA-C  oxyCODONE-acetaminophen (PERCOCET/ROXICET) 5-325 MG tablet Take  1 tablet by mouth every 6 (six) hours as needed for severe pain. 11/06/20  Yes Sarita Hakanson R, PA-C  albuterol (PROVENTIL HFA;VENTOLIN HFA) 108 (90 Base) MCG/ACT inhaler Inhale 2 puffs into the lungs every 4 (four) hours as needed for wheezing or shortness of breath. 08/03/18   Gilda CreasePollina, Christopher J, MD  azithromycin (ZITHROMAX) 250 MG tablet Take 1 tablet (250 mg total) by mouth daily. Take first 2 tablets together, then 1 every day until finished. 09/25/12   Elson AreasSofia, Leslie K, PA-C  benzonatate (TESSALON) 200 MG capsule Take 1 capsule (200 mg total) by mouth 3 (three) times daily as needed for cough. 06/04/19   Law, Waylan BogaAlexandra M, PA-C  cephALEXin (KEFLEX) 500 MG capsule Take 1 capsule (500 mg total) by mouth 4 (four) times daily. 12/24/14   Hess, Nada Boozerobyn M, PA-C  ciprofloxacin (CIPRO) 250 MG tablet Take 1 tablet (250 mg total) by mouth every 12 (twelve) hours. 01/28/14   Purvis SheffieldHarrison, Forrest, MD  diazepam (VALIUM) 5 MG tablet Take 1 tablet (5 mg total) by mouth every 6 (six) hours as needed for muscle spasms. 12/16/15   Leta BaptistNguyen, Emily Roe, MD  Guaifenesin 1200 MG TB12 Take 1 tablet (1,200 mg total) by mouth 2 (two) times daily. 07/02/16   Lawyer, Cristal Deerhristopher, PA-C  ibuprofen (ADVIL,MOTRIN) 600 MG tablet Take 1 tablet (600 mg total) by mouth every 6 (six) hours as needed for  moderate pain. 12/16/15   Leta Baptist, MD  meclizine (ANTIVERT) 25 MG tablet Take 1 tablet (25 mg total) by mouth 3 (three) times daily as needed for dizziness. 12/03/16   Arby Barrette, MD  methocarbamol (ROBAXIN) 500 MG tablet Take 2 tablets (1,000 mg total) by mouth every 8 (eight) hours as needed for muscle spasms. 10/23/17   Loren Racer, MD  metroNIDAZOLE (FLAGYL) 500 MG tablet Take 1 tablet (500 mg total) by mouth 2 (two) times daily. One po bid x 7 days 12/24/14   Kathrynn Speed, PA-C  oseltamivir (TAMIFLU) 75 MG capsule Take 1 capsule (75 mg total) by mouth every 12 (twelve) hours. 08/03/18   Gilda Crease, MD   predniSONE (DELTASONE) 50 MG tablet Take 1 tablet (50 mg total) by mouth daily with breakfast. 07/02/16   Lawyer, Cristal Deer, PA-C  promethazine-dextromethorphan (PROMETHAZINE-DM) 6.25-15 MG/5ML syrup Take 5 mLs by mouth 4 (four) times daily as needed for cough. 07/02/16   Lawyer, Cristal Deer, PA-C    Allergies    Naproxen  Review of Systems   Review of Systems  Constitutional: Positive for appetite change. Negative for activity change, chills, diaphoresis, fatigue and fever.  HENT: Negative.   Respiratory: Negative.   Cardiovascular: Negative.   Gastrointestinal: Positive for abdominal pain, nausea and vomiting. Negative for blood in stool, constipation and diarrhea.  Genitourinary: Negative.   Musculoskeletal: Negative.   Skin: Negative.   Neurological: Negative.   Hematological: Negative.     Physical Exam Updated Vital Signs BP 111/81   Pulse (!) 54   Temp 98.2 F (36.8 C) (Oral)   Resp 16   Ht 5\' 4"  (1.626 m)   Wt 104.3 kg   SpO2 100%   BMI 39.48 kg/m   Physical Exam Vitals and nursing note reviewed.  Constitutional:      Appearance: She is morbidly obese. She is not ill-appearing or toxic-appearing.  HENT:     Head: Normocephalic and atraumatic.     Mouth/Throat:     Mouth: Mucous membranes are moist.     Pharynx: Oropharynx is clear. Uvula midline. No oropharyngeal exudate or posterior oropharyngeal erythema.     Tonsils: No tonsillar exudate.  Eyes:     General:        Right eye: No discharge.        Left eye: No discharge.     Conjunctiva/sclera: Conjunctivae normal.  Neck:     Trachea: Trachea and phonation normal.  Cardiovascular:     Rate and Rhythm: Normal rate and regular rhythm.     Pulses: Normal pulses.     Heart sounds: Normal heart sounds. No murmur heard.   Pulmonary:     Effort: Pulmonary effort is normal. No tachypnea, bradypnea, accessory muscle usage, prolonged expiration or respiratory distress.     Breath sounds: Normal breath  sounds. No wheezing or rales.  Chest:     Chest wall: No mass, lacerations, deformity, swelling, tenderness, crepitus or edema.  Abdominal:     General: Bowel sounds are normal. There is no distension.     Palpations: Abdomen is soft.     Tenderness: There is generalized abdominal tenderness and tenderness in the right upper quadrant and epigastric area. There is no right CVA tenderness, left CVA tenderness, guarding or rebound. Positive signs include Murphy's sign. Negative signs include Rovsing's sign and McBurney's sign.  Musculoskeletal:        General: No deformity.     Cervical back: Normal range of motion  and neck supple. No crepitus. No pain with movement, spinous process tenderness or muscular tenderness.     Right lower leg: No edema.     Left lower leg: No edema.  Lymphadenopathy:     Cervical: No cervical adenopathy.  Skin:    General: Skin is warm and dry.  Neurological:     Mental Status: She is alert. Mental status is at baseline.  Psychiatric:        Mood and Affect: Mood normal.     ED Results / Procedures / Treatments   Labs (all labs ordered are listed, but only abnormal results are displayed) Labs Reviewed  COMPREHENSIVE METABOLIC PANEL - Abnormal; Notable for the following components:      Result Value   Glucose, Bld 100 (*)    Creatinine, Ser 1.07 (*)    All other components within normal limits  CBC WITH DIFFERENTIAL/PLATELET - Abnormal; Notable for the following components:   WBC 11.2 (*)    Neutro Abs 7.9 (*)    All other components within normal limits  URINALYSIS, ROUTINE W REFLEX MICROSCOPIC - Abnormal; Notable for the following components:   Hgb urine dipstick TRACE (*)    Leukocytes,Ua TRACE (*)    All other components within normal limits  URINALYSIS, MICROSCOPIC (REFLEX) - Abnormal; Notable for the following components:   Bacteria, UA FEW (*)    All other components within normal limits  LIPASE, BLOOD  HCG, SERUM, QUALITATIVE     EKG None  Radiology US Abdomen Limited RUQ (LIVER/GB)  Result Date: 11/06/2020 CLINICAL DATA:  Right upper quadrant pain EXAM: ULTRASOUND ABDOMEN LIMITED RIGHT UPPER QUADRANT COMPARISON:  None. FINDINGS: Gallbladder: Within the gallbladder, there is an echogenic focus which moves and shadows measuring 3.7 cm in length consistent with either a single large or multiple adherent gallstones. There is sludge in the gallbladder. There appears to be tumefactive sludge within the gallbladder. No gallbladder wall thickening or pericholecystic fluid. No sonographic Murphy sign noted by sonographer. Common bile duct: Diameter: 3 mm. No intrahepatic or extrahepatic biliary duct dilatation. Liver: No focal lesion identified. Within normal limits in parenchymal echogenicity. Portal vein is patent on color Doppler imaging with normal direction of blood flow towards the liver. Other: None. IMPRESSION: Cholelithiasis an apparent tumefactive sludge within the gallbladder. No gallbladder wall thickening or pericholecystic fluid. Study otherwise unremarkable. Electronically Signed   By: Bretta Bang III M.D.   On: 11/06/2020 12:32    Procedures Procedures   Medications Ordered in ED Medications  fentaNYL (SUBLIMAZE) injection 50 mcg (50 mcg Intravenous Given 11/06/20 1207)  ondansetron (ZOFRAN) injection 4 mg (4 mg Intravenous Given 11/06/20 1207)  fentaNYL (SUBLIMAZE) injection 50 mcg (50 mcg Intravenous Given 11/06/20 1437)    ED Course  I have reviewed the triage vital signs and the nursing notes.  Pertinent labs & imaging results that were available during my care of the patient were reviewed by me and considered in my medical decision making (see chart for details).    MDM Rules/Calculators/A&P                         35 year old female who presents with concern for right upper quadrant pain with associated nausea and vomiting.  Differential diagnosis includes but is not limited to cholelithiasis,  choledocholithiasis, acute cholecystitis or cholangitis, pancreatitis, peptic ulcer disease, gastritis, pyelonephritis, pneumonia, renal stone or ureteral stone, appendicitis, MI, bowel obstruction.  Patient hypertensive on intake, vital signs otherwise normal.  Cardiopulmonary exam is normal, abdominal exam with epigastric and right upper quadrant tenderness to palpation without rebound or guarding.  Patient is no longer vomiting in the ED at this time.  CBC with mild leukocytosis of 11,000, CMP With very mildly elevated creatinine to 1.07, baseline of 0.9.  UA unremarkable, lipase is normal.  Right upper quadrant ultrasound revealed cholelithiasis with sludge without signs of acute cholecystitis.  Patient's symptoms are much improved after administration of antiemetic and analgesic medication in the ED.  No further work-up warranted in ED at this time.  Patient symptoms are secondary to biliary colic.  Patient is feeling much improved with medications, feel she may be safely discharged home with general surgery follow-up.  Will discharge with antiemetic and analgesic prescriptions as well as general surgery contact information.  Bridget Griffin voiced understanding of her medical evaluation and treatment plan.  Each of her questions was answered to her expressed satisfaction.  Return precautions were given.  Patient is well-appearing, stable, and appropriate for discharge at this time.  This chart was dictated using voice recognition software, Dragon. Despite the best efforts of this provider to proofread and correct errors, errors may still occur which can change documentation meaning.  Final Clinical Impression(s) / ED Diagnoses Final diagnoses:  RUQ pain  Calculus of gallbladder without cholecystitis without obstruction    Rx / DC Orders ED Discharge Orders         Ordered    ondansetron (ZOFRAN ODT) 4 MG disintegrating tablet  Every 8 hours PRN        11/06/20 1516    oxyCODONE-acetaminophen  (PERCOCET/ROXICET) 5-325 MG tablet  Every 6 hours PRN        11/06/20 1516           Leannah Guse, Eugene Gavia, PA-C 11/06/20 1919    Gwyneth Sprout, MD 11/08/20 [phone]

## 2020-11-06 NOTE — ED Triage Notes (Signed)
Vomiting and abd pain  Was seen by GI MD - has [place] scheduled for next week - Started vomiting at working and having 7/10 epigastric pain

## 2020-12-09 ENCOUNTER — Other Ambulatory Visit: Payer: Self-pay | Admitting: Surgery

## 2020-12-25 ENCOUNTER — Other Ambulatory Visit: Payer: Self-pay

## 2020-12-25 ENCOUNTER — Encounter (HOSPITAL_BASED_OUTPATIENT_CLINIC_OR_DEPARTMENT_OTHER): Payer: Self-pay | Admitting: Surgery

## 2021-01-06 ENCOUNTER — Encounter (HOSPITAL_BASED_OUTPATIENT_CLINIC_OR_DEPARTMENT_OTHER)
Admission: RE | Admit: 2021-01-06 | Discharge: 2021-01-06 | Disposition: A | Discharge status: Home / Self Care | Payer: BC Managed Care – PPO | Source: Ambulatory Visit | Attending: Surgery | Admitting: Surgery

## 2021-01-06 DIAGNOSIS — Z01812 Encounter for preprocedural laboratory examination: Secondary | ICD-10-CM | POA: Insufficient documentation

## 2021-01-06 DIAGNOSIS — K8010 Calculus of gallbladder with chronic cholecystitis without obstruction: Secondary | ICD-10-CM | POA: Diagnosis present

## 2021-01-06 DIAGNOSIS — Z79899 Other long term (current) drug therapy: Secondary | ICD-10-CM | POA: Diagnosis present

## 2021-01-06 DIAGNOSIS — Z8249 Family history of ischemic heart disease and other diseases of the circulatory system: Secondary | ICD-10-CM | POA: Diagnosis not present

## 2021-01-06 DIAGNOSIS — F17200 Nicotine dependence, unspecified, uncomplicated: Secondary | ICD-10-CM | POA: Diagnosis not present

## 2021-01-06 DIAGNOSIS — Z836 Family history of other diseases of the respiratory system: Secondary | ICD-10-CM | POA: Diagnosis not present

## 2021-01-06 DIAGNOSIS — K828 Other specified diseases of gallbladder: Secondary | ICD-10-CM | POA: Diagnosis not present

## 2021-01-06 LAB — BASIC METABOLIC PANEL
Anion gap: 6 (ref 5–15)
BUN: 9 mg/dL (ref 6–20)
CO2: 26 mmol/L (ref 22–32)
Calcium: 9.1 mg/dL (ref 8.9–10.3)
Chloride: 105 mmol/L (ref 98–111)
Creatinine, Ser: 0.99 mg/dL (ref 0.44–1.00)
GFR, Estimated: >60 mL/min (ref >60)
Glucose, Bld: 87 mg/dL (ref 70–99)
Potassium: 4.5 mmol/L (ref 3.5–5.1)
Sodium: 137 mmol/L (ref 135–145)

## 2021-01-06 LAB — POCT PREGNANCY, URINE: Preg Test, Ur: NEGATIVE

## 2021-01-06 MED ORDER — ENSURE PRE-SURGERY PO LIQD: 296.0000 mL | Freq: Once | ORAL | Status: DC

## 2021-01-06 NOTE — Progress Notes (Signed)

## 2021-01-06 NOTE — Progress Notes (Signed)
Reminded pt to come in for lab work, pt verbalized understanding.  

## 2021-01-06 NOTE — H&P (Signed)
Bridget Griffin Appointment: 12/09/2020 10:20 AM Location: [facility] Patient #: [id number] DOB: 11-21-1985 Single / Language: [place] / Race: Black or African American Female   History of Present Illness (Oletha Tolson A. Magnus Ivan MD; 12/09/2020 11:18 AM) The patient is a 35 year old female who presents for evaluation of gall stones.  Chief complaint: Symptomatic gallstones  This is a 35 year old female recently seen in the emergency department for symptomatic gallstones.  For almost more than a year she's been having epigastric abdominal pain going up into her chest.  She has now had several episodes of nausea and bloating with worsening pain in the right upper quadrant after fatty meals.  She has gone a low-fat diet which is improved her symptoms somewhat but she is still having attacks from occasion.  The pain was described as moderate to severe intensity.  She denies jaundice.  Bowel movements are normal.  She underwent an ultrasound showing multiple gallstones and thick sludge in the gallbladder.  Bile duct was normal.  She is otherwise healthy without complaints.   Allergies Marliss Coots, CNA; 12/09/2020 10:53 AM) Naproxen *ANALGESICS - ANTI-INFLAMMATORY*   Allergies Reconciled    Medication History Marliss Coots, CNA; 12/09/2020 10:54 AM) oxyCODONE-Acetaminophen  (5-325MG  Tablet, Oral) Active. Spironolactone  (50MG  Tablet, Oral) Active. Ondansetron  (4MG  Tablet Disint, Oral) Active. FLUoxetine HCl  (10MG  Capsule, Oral) Active. Medications Reconciled  Multi-Vitamin  (Oral) Active. Tylenol  (80MG /0.8ML Suspension, Oral) Active.  Social History , CMA; 12/09/2020 10:58 AM) Alcohol use   Moderate alcohol use. No caffeine use   No drug use   Tobacco use   Current every day smoker.  Family History , ; 12/09/2020 10:58 AM) Alcohol Abuse   Father. Depression   Father. Heart Disease   Father. Heart disease in female family member before age 52    Respiratory Condition   Mother.    Review of Systems Harold Barban CMA; 12/09/2020 10:58 AM) General Not Present- Appetite Loss, Chills, Fatigue, Fever, Night Sweats, Weight Gain and Weight Loss. Skin Not Present- Change in Wart/Mole, Dryness, Hives, Jaundice, New Lesions, Non-Healing Wounds, Rash and Ulcer. HEENT Not Present- Earache, Hearing Loss, Hoarseness, Nose Bleed, Oral Ulcers, Ringing in the Ears, Seasonal Allergies, Sinus Pain, Sore Throat, Visual Disturbances, Wears glasses/contact lenses and Yellow Eyes. Respiratory Not Present- Bloody sputum, Chronic Cough, Difficulty Breathing, Snoring and Wheezing. Breast Not Present- Breast Mass, Breast Pain, Nipple Discharge and Skin Changes. Cardiovascular Not Present- Chest Pain, Difficulty Breathing Lying Down, Leg Cramps, Palpitations, Rapid Heart Rate, Shortness of Breath and Swelling of Extremities. Gastrointestinal Present- Abdominal Pain, Bloating and Indigestion. Not Present- Bloody Stool, Change in Bowel Habits, Chronic diarrhea, Constipation, Difficulty Swallowing, Excessive gas, Gets full quickly at meals, Hemorrhoids, Nausea, Rectal Pain and Vomiting. Female Genitourinary Not Present- Frequency, Nocturia, Painful Urination, Pelvic Pain and Urgency. Musculoskeletal Not Present- Back Pain, Joint Pain, Joint Stiffness, Muscle Pain, Muscle Weakness and Swelling of Extremities. Neurological Not Present- Decreased Memory, Fainting, Headaches, Numbness, Seizures, Tingling, Tremor, Trouble walking and Weakness. Psychiatric Present- Anxiety and Change in Sleep Pattern. Not Present- Bipolar, Depression, Fearful and Frequent crying. Endocrine Not Present- Cold Intolerance, Excessive Hunger, Hair Changes, Heat Intolerance, Hot flashes and New Diabetes. Hematology Not Present- Blood Thinners, Easy Bruising, Excessive bleeding, Gland problems, HIV and Persistent Infections.  Vitals (Donyelle Alston CNA; 12/09/2020 10:52 AM) 12/09/2020 10:51  AM Weight: 217.25 lb   Height: 64 in  Body Surface Area: 2.03 m   Body Mass Index: 37.29 kg/m   Temp.:  98.4 F    Pulse: 95 (Regular)    P.OX: 99% (Room air) BP: 120/80(Sitting, Left Arm, Standard)       Physical Exam (Sanyiah Kanzler A. Magnus Ivan MD; 12/09/2020 11:18 AM) The physical exam findings are as follows: Note:  Today, she appears well exam  Her abdomen is soft and nontender today. There are no masses. There is no hepatomegaly  Lungs clear  CV RRR  Skin without jaundice    Assessment & Plan   SYMPTOMATIC CHOLELITHIASIS (K80.20)  Impression: I reviewed her notes in the electronic medical records as well as her ultrasound. She does have symptomatic cholelithiasis. Given the ultrasound findings and her symptoms, cholecystectomy is recommended. I discussed the procedure in detail. The patient was given Agricultural engineer. We discussed the risks and benefits of a laparoscopic cholecystectomy and possible cholangiogram including, but not limited to, bleeding, infection, injury to surrounding structures such as the intestine or liver, bile leak, retained gallstones, need to convert to an open procedure, prolonged diarrhea, blood clots such as DVT, common bile duct injury, anesthesia risks, and possible need for additional procedures. The likelihood of improvement in symptoms and return to the patient's normal status is good. We discussed the typical post-operative recovery course. All questions were answered.

## 2021-01-06 NOTE — H&P (View-Only) (Signed)

## 2021-01-06 NOTE — Anesthesia Preprocedure Evaluation (Addendum)
Anesthesia Evaluation  Patient identified by MRN, date of birth, ID band Patient awake    Reviewed: Allergy & Precautions, NPO status , Patient's Chart, lab work & pertinent test results  Airway Mallampati: II  TM Distance: >3 FB Neck ROM: Full    Dental no notable dental hx. (+) Teeth Intact, Dental Advisory Given   Pulmonary asthma , Current Smoker,    Pulmonary exam normal breath sounds clear to auscultation       Cardiovascular Exercise Tolerance: Good Normal cardiovascular exam Rhythm:Regular Rate:Normal     Neuro/Psych negative neurological ROS  negative psych ROS   GI/Hepatic negative GI ROS, Neg liver ROS,   Endo/Other  negative endocrine ROS  Renal/GU negative Renal ROS     Musculoskeletal negative musculoskeletal ROS (+)   Abdominal (+) + obese,   Peds  Hematology   Anesthesia Other Findings   Reproductive/Obstetrics negative OB ROS                            Anesthesia Physical Anesthesia Plan  ASA: 2  Anesthesia Plan: General   Post-op Pain Management:    Induction: Intravenous  PONV Risk Score and Plan: Treatment may vary due to age or medical condition, Midazolam and Ondansetron  Airway Management Planned: Oral ETT  Additional Equipment: None  Intra-op Plan:   Post-operative Plan: Extubation in OR  Informed Consent: I have reviewed the patients History and Physical, chart, labs and discussed the procedure including the risks, benefits and alternatives for the proposed anesthesia with the patient or authorized representative who has indicated his/her understanding and acceptance.     Dental advisory given  Plan Discussed with: CRNA  Anesthesia Plan Comments:        Anesthesia Quick Evaluation

## 2021-01-07 ENCOUNTER — Encounter (HOSPITAL_BASED_OUTPATIENT_CLINIC_OR_DEPARTMENT_OTHER): Payer: Self-pay | Admitting: Surgery

## 2021-01-07 ENCOUNTER — Other Ambulatory Visit: Payer: Self-pay

## 2021-01-07 ENCOUNTER — Ambulatory Visit (HOSPITAL_BASED_OUTPATIENT_CLINIC_OR_DEPARTMENT_OTHER)
Admission: RE | Admit: 2021-01-07 | Discharge: 2021-01-07 | Disposition: A | Discharge status: Home / Self Care | Payer: BC Managed Care – PPO | Attending: Surgery | Admitting: Surgery

## 2021-01-07 ENCOUNTER — Ambulatory Visit (HOSPITAL_BASED_OUTPATIENT_CLINIC_OR_DEPARTMENT_OTHER): Payer: BC Managed Care – PPO | Admitting: Anesthesiology

## 2021-01-07 ENCOUNTER — Encounter (HOSPITAL_BASED_OUTPATIENT_CLINIC_OR_DEPARTMENT_OTHER)
Admission: RE | Disposition: A | Discharge status: Home / Self Care | Payer: Self-pay | Source: Home / Self Care | Attending: Surgery

## 2021-01-07 DIAGNOSIS — F17200 Nicotine dependence, unspecified, uncomplicated: Secondary | ICD-10-CM | POA: Insufficient documentation

## 2021-01-07 DIAGNOSIS — K8010 Calculus of gallbladder with chronic cholecystitis without obstruction: Secondary | ICD-10-CM | POA: Insufficient documentation

## 2021-01-07 DIAGNOSIS — Z836 Family history of other diseases of the respiratory system: Secondary | ICD-10-CM | POA: Insufficient documentation

## 2021-01-07 DIAGNOSIS — Z8249 Family history of ischemic heart disease and other diseases of the circulatory system: Secondary | ICD-10-CM | POA: Insufficient documentation

## 2021-01-07 DIAGNOSIS — K828 Other specified diseases of gallbladder: Secondary | ICD-10-CM | POA: Insufficient documentation

## 2021-01-07 HISTORY — PX: CHOLECYSTECTOMY: SHX55

## 2021-01-07 HISTORY — DX: Personal history of other diseases of the digestive system: Z87.19

## 2021-01-07 HISTORY — DX: Hidradenitis suppurativa: L73.2

## 2021-01-07 SURGERY — LAPAROSCOPIC CHOLECYSTECTOMY
Anesthesia: General | Site: Abdomen

## 2021-01-07 MED ORDER — ONDANSETRON HCL 4 MG/2ML IJ SOLN
INTRAMUSCULAR | Status: DC | PRN
  Administered 2021-01-07: 4 mg via INTRAVENOUS

## 2021-01-07 MED ORDER — HYDROMORPHONE HCL 1 MG/ML IJ SOLN: 0.2500 mg | INTRAMUSCULAR | Status: DC | PRN

## 2021-01-07 MED ORDER — ACETAMINOPHEN 500 MG PO TABS
1000.0000 mg | ORAL_TABLET | ORAL | Status: AC
  Administered 2021-01-07: 1000 mg via ORAL

## 2021-01-07 MED ORDER — KETOROLAC TROMETHAMINE 30 MG/ML IJ SOLN: 30.0000 mg | Freq: Once | INTRAMUSCULAR | Status: DC | PRN

## 2021-01-07 MED ORDER — BUPIVACAINE-EPINEPHRINE (PF) 0.5% -1:200000 IJ SOLN
INTRAMUSCULAR | Status: DC | PRN
  Administered 2021-01-07: 20 mL

## 2021-01-07 MED ORDER — CHLORHEXIDINE GLUCONATE CLOTH 2 % EX PADS: 6.0000 | MEDICATED_PAD | Freq: Once | CUTANEOUS | Status: DC

## 2021-01-07 MED ORDER — CEFAZOLIN SODIUM-DEXTROSE 2-4 GM/100ML-% IV SOLN
INTRAVENOUS | Status: AC
  Filled 2021-01-07: qty 100

## 2021-01-07 MED ORDER — SODIUM CHLORIDE 0.9 % IR SOLN
Status: DC | PRN
  Administered 2021-01-07: 1500 mL

## 2021-01-07 MED ORDER — SUGAMMADEX SODIUM 200 MG/2ML IV SOLN
INTRAVENOUS | Status: DC | PRN
  Administered 2021-01-07: 200 mg via INTRAVENOUS

## 2021-01-07 MED ORDER — LIDOCAINE HCL (CARDIAC) PF 100 MG/5ML IV SOSY
PREFILLED_SYRINGE | INTRAVENOUS | Status: DC | PRN
  Administered 2021-01-07: 100 mg via INTRAVENOUS

## 2021-01-07 MED ORDER — DIPHENHYDRAMINE HCL 50 MG/ML IJ SOLN
INTRAMUSCULAR | Status: DC | PRN
  Administered 2021-01-07: 12.5 mg via INTRAVENOUS

## 2021-01-07 MED ORDER — ACETAMINOPHEN 500 MG PO TABS
ORAL_TABLET | ORAL | Status: AC
  Filled 2021-01-07: qty 2

## 2021-01-07 MED ORDER — ROCURONIUM BROMIDE 100 MG/10ML IV SOLN
INTRAVENOUS | Status: DC | PRN
  Administered 2021-01-07: 50 mg via INTRAVENOUS
  Administered 2021-01-07: 10 mg via INTRAVENOUS

## 2021-01-07 MED ORDER — FENTANYL CITRATE (PF) 100 MCG/2ML IJ SOLN
INTRAMUSCULAR | Status: DC | PRN
  Administered 2021-01-07 (×4): 50 ug via INTRAVENOUS

## 2021-01-07 MED ORDER — DEXAMETHASONE SODIUM PHOSPHATE 4 MG/ML IJ SOLN
INTRAMUSCULAR | Status: DC | PRN
  Administered 2021-01-07: 5 mg via INTRAVENOUS

## 2021-01-07 MED ORDER — FENTANYL CITRATE (PF) 100 MCG/2ML IJ SOLN
INTRAMUSCULAR | Status: AC
  Filled 2021-01-07: qty 2

## 2021-01-07 MED ORDER — MIDAZOLAM HCL 5 MG/5ML IJ SOLN
INTRAMUSCULAR | Status: DC | PRN
  Administered 2021-01-07: 2 mg via INTRAVENOUS

## 2021-01-07 MED ORDER — OXYCODONE HCL 5 MG PO TABS: 5.0000 mg | ORAL_TABLET | Freq: Once | ORAL | Status: DC | PRN

## 2021-01-07 MED ORDER — AMISULPRIDE (ANTIEMETIC) 5 MG/2ML IV SOLN: 10.0000 mg | Freq: Once | INTRAVENOUS | Status: DC | PRN

## 2021-01-07 MED ORDER — OXYCODONE HCL 5 MG/5ML PO SOLN: 5.0000 mg | Freq: Once | ORAL | Status: DC | PRN

## 2021-01-07 MED ORDER — MIDAZOLAM HCL 2 MG/2ML IJ SOLN
INTRAMUSCULAR | Status: AC
  Filled 2021-01-07: qty 2

## 2021-01-07 MED ORDER — PROPOFOL 10 MG/ML IV BOLUS
INTRAVENOUS | Status: DC | PRN
  Administered 2021-01-07: 170 mg via INTRAVENOUS

## 2021-01-07 MED ORDER — DIPHENHYDRAMINE HCL 50 MG/ML IJ SOLN
INTRAMUSCULAR | Status: AC
  Filled 2021-01-07: qty 1

## 2021-01-07 MED ORDER — ONDANSETRON HCL 4 MG/2ML IJ SOLN: 4.0000 mg | Freq: Once | INTRAMUSCULAR | Status: DC | PRN

## 2021-01-07 MED ORDER — LACTATED RINGERS IV SOLN: INTRAVENOUS | Status: DC

## 2021-01-07 MED ORDER — CEFAZOLIN SODIUM-DEXTROSE 2-4 GM/100ML-% IV SOLN
2.0000 g | INTRAVENOUS | Status: AC
  Administered 2021-01-07: 2 g via INTRAVENOUS

## 2021-01-07 MED ORDER — OXYCODONE HCL 5 MG PO TABS: 5.0000 mg | ORAL_TABLET | Freq: Four times a day (QID) | ORAL | 0 refills | Status: AC | PRN

## 2021-01-07 SURGICAL SUPPLY — 44 items
ADH SKN CLS APL DERMABOND .7 (GAUZE/BANDAGES/DRESSINGS) ×1
APL PRP STRL LF DISP 70% ISPRP (MISCELLANEOUS) ×1
APPLIER CLIP 5 13 M/L LIGAMAX5 (MISCELLANEOUS) ×2
APR CLP MED LRG 5 ANG JAW (MISCELLANEOUS) ×1
BAG SPEC RTRVL LRG 6X4 10 (ENDOMECHANICALS) ×1
BLADE CLIPPER SURG (BLADE) IMPLANT
CHLORAPREP W/TINT 26 (MISCELLANEOUS) ×2 IMPLANT
CLIP APPLIE 5 13 M/L LIGAMAX5 (MISCELLANEOUS) ×1 IMPLANT
COVER MAYO STAND STRL (DRAPES) IMPLANT
DECANTER SPIKE VIAL GLASS SM (MISCELLANEOUS) ×2 IMPLANT
DERMABOND ADVANCED (GAUZE/BANDAGES/DRESSINGS) ×1
DERMABOND ADVANCED .7 DNX12 (GAUZE/BANDAGES/DRESSINGS) ×1 IMPLANT
DRAPE C-ARM 42X72 X-RAY (DRAPES) IMPLANT
ELECT REM PT RETURN 9FT ADLT (ELECTROSURGICAL) ×2
ELECTRODE REM PT RTRN 9FT ADLT (ELECTROSURGICAL) ×1 IMPLANT
GLOVE SURG ENC MOIS LTX SZ6.5 (GLOVE) ×1 IMPLANT
GLOVE SURG ENC MOIS LTX SZ7 (GLOVE) ×1 IMPLANT
GLOVE SURG SIGNA 7.5 PF LTX (GLOVE) ×2 IMPLANT
GLOVE SURG UNDER POLY LF SZ6.5 (GLOVE) ×1 IMPLANT
GLOVE SURG UNDER POLY LF SZ7 (GLOVE) ×1 IMPLANT
GOWN STRL REUS W/ TWL LRG LVL3 (GOWN DISPOSABLE) ×2 IMPLANT
GOWN STRL REUS W/ TWL XL LVL3 (GOWN DISPOSABLE) ×1 IMPLANT
GOWN STRL REUS W/TWL LRG LVL3 (GOWN DISPOSABLE) ×4
GOWN STRL REUS W/TWL XL LVL3 (GOWN DISPOSABLE) ×2
IRRIG SUCT STRYKERFLOW 2 WTIP (MISCELLANEOUS) ×2
IRRIGATION SUCT STRKRFLW 2 WTP (MISCELLANEOUS) ×1 IMPLANT
IV NS IRRIG 3000ML ARTHROMATIC (IV SOLUTION) ×1 IMPLANT
NS IRRIG 1000ML POUR BTL (IV SOLUTION) ×2 IMPLANT
PACK BASIN DAY SURGERY FS (CUSTOM PROCEDURE TRAY) ×2 IMPLANT
POUCH SPECIMEN RETRIEVAL 10MM (ENDOMECHANICALS) ×2 IMPLANT
SCISSORS LAP 5X35 DISP (ENDOMECHANICALS) ×2 IMPLANT
SET CHOLANGIOGRAPH 5 50 .035 (SET/KITS/TRAYS/PACK) IMPLANT
SET TUBE SMOKE EVAC HIGH FLOW (TUBING) ×2 IMPLANT
SLEEVE ENDOPATH XCEL 5M (ENDOMECHANICALS) ×4 IMPLANT
SLEEVE SCD COMPRESS KNEE MED (STOCKING) ×2 IMPLANT
SOL ANTI FOG 6CC (MISCELLANEOUS) IMPLANT
SOLUTION ANTI FOG 6CC (MISCELLANEOUS) ×1
SPECIMEN JAR SMALL (MISCELLANEOUS) ×1 IMPLANT
SUT MON AB 4-0 PC3 18 (SUTURE) ×2 IMPLANT
TOWEL GREEN STERILE FF (TOWEL DISPOSABLE) ×2 IMPLANT
TRAY LAPAROSCOPIC (CUSTOM PROCEDURE TRAY) ×2 IMPLANT
TROCAR XCEL BLUNT TIP 100MML (ENDOMECHANICALS) ×2 IMPLANT
TROCAR XCEL NON-BLD 5MMX100MML (ENDOMECHANICALS) ×2 IMPLANT
TUBE CONNECTING 20X1/4 (TUBING) ×1 IMPLANT

## 2021-01-07 NOTE — Anesthesia Procedure Notes (Signed)
Procedure Name: Intubation Date/Time: 01/07/2021 10:40 AM Performed by: Ezequiel Kayser, CRNA Pre-anesthesia Checklist: Patient identified, Emergency Drugs available, Suction available and Patient being monitored Patient Re-evaluated:Patient Re-evaluated prior to induction Oxygen Delivery Method: Circle System Utilized Preoxygenation: Pre-oxygenation with 100% oxygen Induction Type: IV induction Ventilation: Mask ventilation without difficulty Laryngoscope Size: Mac and 3 Grade View: Grade I Tube type: Oral Tube size: 7.0 mm Number of attempts: 1 Airway Equipment and Method: Stylet and Oral airway Placement Confirmation: ETT inserted through vocal cords under direct vision, positive ETCO2 and breath sounds checked- equal and bilateral Secured at: 22 cm Tube secured with: Tape Dental Injury: Teeth and Oropharynx as per pre-operative assessment

## 2021-01-07 NOTE — Op Note (Signed)
Laparoscopic Cholecystectomy Procedure Note  Indications: This patient presents with symptomatic gallbladder disease and will undergo laparoscopic cholecystectomy.  Pre-operative Diagnosis: symptomatic cholelithiasis  Post-operative Diagnosis: acute cholecystitis with cholelithiasis  Surgeon: Abigail Miyamoto   Assistants: Freeman Caldron, MD ([facility] Resident)  Anesthesia: General endotracheal anesthesia  ASA Class: 1  Procedure Details  The patient was seen again in the Holding Room. The risks, benefits, complications, treatment options, and expected outcomes were discussed with the patient. The possibilities of reaction to medication, pulmonary aspiration, perforation of viscus, bleeding, recurrent infection, finding a normal gallbladder, the need for additional procedures, failure to diagnose a condition, the possible need to convert to an open procedure, and creating a complication requiring transfusion or operation were discussed with the patient. The likelihood of improving the patient's symptoms with return to their baseline status is good.  The patient and/or family concurred with the proposed plan, giving informed consent. The site of surgery properly noted. The patient was taken to Operating Room, identified as [name] and the procedure verified as Laparoscopic Cholecystectomy with Intraoperative Cholangiogram. A Time Out was held and the above information confirmed.  Prior to the induction of general anesthesia, antibiotic prophylaxis was administered. General endotracheal anesthesia was then administered and tolerated well. After the induction, the abdomen was prepped with Chloraprep and draped in sterile fashion. The patient was positioned in the supine position.  Local anesthetic agent was injected into the skin near the umbilicus and an incision made. We dissected down to the abdominal fascia with blunt dissection.  The fascia was incised vertically and we entered the  peritoneal cavity bluntly.  A pursestring suture of 0-Vicryl was placed around the fascial opening.  The Hasson cannula was inserted and secured with the stay suture.  Pneumoperitoneum was then created with CO2 and tolerated well without any adverse changes in the patient's vital signs. An 11-mm port was placed in the subxiphoid position.  Two 5-mm ports were placed in the right upper quadrant. All skin incisions were infiltrated with a local anesthetic agent before making the incision and placing the trocars.   We positioned the patient in reverse Trendelenburg, tilted slightly to the patient's left.  The gallbladder was identified and appeared acutely inflamed with large stones including one in the neck. The fundus grasped and retracted cephalad. Adhesions were lysed bluntly and with the electrocautery where indicated, taking care not to injure any adjacent organs or viscus. The infundibulum was grasped and retracted laterally, exposing the peritoneum overlying the triangle of Calot. This was then divided and exposed in a blunt fashion. The cystic duct was identified and bluntly dissected circumferentially. A critical view of the cystic duct and cystic artery was obtained.  The cystic duct was then ligated with clips and divided. The cystic artery was, dissected free, ligated with clips and divided as well.   The gallbladder was dissected from the liver bed in retrograde fashion with the electrocautery. The gallbladder was removed and placed in an Endocatch sac. The liver bed was irrigated and inspected. Hemostasis was achieved with the electrocautery. Copious irrigation was utilized and was repeatedly aspirated until clear.  The gallbladder and Endocatch sac were then removed through the umbilical port site.  The pursestring suture was used to close the umbilical fascia.    We again inspected the right upper quadrant for hemostasis.  Pneumoperitoneum was released as we removed the trocars.  4-0 Monocryl was  used to close the skin.   Benzoin, steri-strips, and clean dressings were applied. The  patient was then extubated and brought to the recovery room in stable condition. Instrument, sponge, and needle counts were correct at closure and at the conclusion of the case.   Findings: Cholecystitis with Cholelithiasis  Estimated Blood Loss: Minimal         Drains:          Specimens: Gallbladder           Complications: None; patient tolerated the procedure well.         Disposition: PACU - hemodynamically stable.         Condition: stable

## 2021-01-07 NOTE — Transfer of Care (Signed)
Immediate Anesthesia Transfer of Care Note  Patient: Bridget Griffin  Procedure(s) Performed: LAPAROSCOPIC CHOLECYSTECTOMY (Abdomen)  Patient Location: PACU  Anesthesia Type:General  Level of Consciousness: drowsy  Airway & Oxygen Therapy: Patient Spontanous Breathing and Patient connected to face mask oxygen  Post-op Assessment: Report given to RN, Post -op Vital signs reviewed and stable and Patient moving all extremities X 4  Post vital signs: Reviewed and stable  Last Vitals:  Vitals Value Taken Time  BP 125/66 01/07/21 1206  Temp    Pulse 84 01/07/21 1215  Resp 27 01/07/21 1215  SpO2 98 % 01/07/21 1215  Vitals shown include unvalidated device data.  Last Pain:  Vitals:   01/07/21 0952  TempSrc: Oral  PainSc: 0-No pain      Patients Stated Pain Goal: 4 (01/07/21 0962)  Complications: No notable events documented.

## 2021-01-07 NOTE — Anesthesia Postprocedure Evaluation (Signed)
Anesthesia Post Note  Patient: Bridget Griffin  Procedure(s) Performed: LAPAROSCOPIC CHOLECYSTECTOMY (Abdomen)     Patient location during evaluation: PACU Anesthesia Type: General Level of consciousness: awake and alert Pain management: pain level controlled Vital Signs Assessment: post-procedure vital signs reviewed and stable Respiratory status: spontaneous breathing, nonlabored ventilation, respiratory function stable and patient connected to nasal cannula oxygen Cardiovascular status: blood pressure returned to baseline and stable Postop Assessment: no apparent nausea or vomiting Anesthetic complications: no   No notable events documented.  Last Vitals:  Vitals:   01/07/21 1230 01/07/21 1310  BP: 121/85 118/88  Pulse: 72 65  Resp: (!) 32 18  Temp:  37.1 C  SpO2: 92% 96%    Last Pain:  Vitals:   01/07/21 1310  TempSrc:   PainSc: 0-No pain                 Trevor Iha

## 2021-01-07 NOTE — Interval H&P Note (Signed)
History and Physical Interval Note:no change in H and P  01/07/2021 9:34 AM  [name]  has presented today for surgery, with the diagnosis of symptomatic gallstones.  The various methods of treatment have been discussed with the patient and family. After consideration of risks, benefits and other options for treatment, the patient has consented to  Procedure(s): LAPAROSCOPIC CHOLECYSTECTOMY (N/A) as a surgical intervention.  The patient's history has been reviewed, patient examined, no change in status, stable for surgery.  I have reviewed the patient's chart and labs.  Questions were answered to the patient's satisfaction.     Bridget Griffin

## 2021-01-07 NOTE — Discharge Instructions (Addendum)
*You had 1000 mg of Tylenol at 10am  [facility] ______[facility] LAPAROSCOPIC SURGERY: POST OP INSTRUCTIONS Always review your discharge instruction sheet given to you by the facility where your surgery was performed. IF YOU HAVE DISABILITY OR FAMILY LEAVE FORMS, YOU MUST BRING THEM TO THE OFFICE FOR PROCESSING.   DO NOT GIVE THEM TO YOUR DOCTOR.  A prescription for pain medication may be given to you upon discharge.  Take your pain medication as prescribed, if needed.  If narcotic pain medicine is not needed, then you may take acetaminophen (Tylenol) or ibuprofen (Advil) as needed. Take your usually prescribed medications unless otherwise directed. If you need a refill on your pain medication, please contact your pharmacy.  They will contact our office to request authorization. Prescriptions will not be filled after 5pm or on week-ends. You should follow a light diet the first few days after arrival home, such as soup and crackers, etc.  Be sure to include lots of fluids daily. Most patients will experience some swelling and bruising in the area of the incisions.  Ice packs will help.  Swelling and bruising can take several days to resolve.  It is common to experience some constipation if taking pain medication after surgery.  Increasing fluid intake and taking a stool softener (such as Colace) will usually help or prevent this problem from occurring.  A mild laxative (Milk of Magnesia or Miralax) should be taken according to package instructions if there are no bowel movements after 48 hours. Unless discharge instructions indicate otherwise, you may remove your bandages 24-48 hours after surgery, and you may shower at that time.  You may have steri-strips (small skin tapes) in place directly over the incision.  These strips should be left on the skin for 7-10 days.  If your surgeon used skin glue on the incision, you may shower in 24 hours.  The glue will flake off over the next 2-3  weeks.  Any sutures or staples will be removed at the office during your follow-up visit. ACTIVITIES:  You may resume regular (light) daily activities beginning the next day--such as daily self-care, walking, climbing stairs--gradually increasing activities as tolerated.  You may have sexual intercourse when it is comfortable.  Refrain from any heavy lifting or straining until approved by your doctor. You may drive when you are no longer taking prescription pain medication, you can comfortably wear a seatbelt, and you can safely maneuver your car and apply brakes. RETURN TO WORK:  __________________________________________________________ Bridget Griffin should see your doctor in the office for a follow-up appointment approximately 2-3 weeks after your surgery.  Make sure that you call for this appointment within a day or two after you arrive home to insure a convenient appointment time. OTHER INSTRUCTIONS: __________________________________________________________________________________________________________________________ __________________________________________________________________________________________________________________________ WHEN TO CALL YOUR DOCTOR: Fever over 101.0 Inability to urinate Continued bleeding from incision. Increased pain, redness, or drainage from the incision. Increasing abdominal pain  The clinic staff is available to answer your questions during regular business hours.  Please don't hesitate to call and ask to speak to one of the nurses for clinical concerns.  If you have a medical emergency, go to the nearest emergency room or call 911.  A surgeon from [facility] is always on call at the hospital. [address], [address], [place], [place]  [address] ? [address] [address], [place], [place]   [address] [phone] ? [phone] ? FAX [phone] Web site: [email or web address]    Post Anesthesia Home Care  Instructions  Activity: Get plenty  of rest for the remainder of the day. A responsible individual must stay with you for 24 hours following the procedure.  For the next 24 hours, DO NOT: -Drive a car -Advertising copywriter -Drink alcoholic beverages -Take any medication unless instructed by your physician -Make any legal decisions or sign important papers.  Meals: Start with liquid foods such as gelatin or soup. Progress to regular foods as tolerated. Avoid greasy, spicy, heavy foods. If nausea and/or vomiting occur, drink only clear liquids until the nausea and/or vomiting subsides. Call your physician if vomiting continues.  Special Instructions/Symptoms: Your throat may feel dry or sore from the anesthesia or the breathing tube placed in your throat during surgery. If this causes discomfort, gargle with warm salt water. The discomfort should disappear within 24 hours.  If you had a scopolamine patch placed behind your ear for the management of post- operative nausea and/or vomiting:  1. The medication in the patch is effective for 72 hours, after which it should be removed.  Wrap patch in a tissue and discard in the trash. Wash hands thoroughly with soap and water. 2. You may remove the patch earlier than 72 hours if you experience unpleasant side effects which may include dry mouth, dizziness or visual disturbances. 3. Avoid touching the patch. Wash your hands with soap and water after contact with the patch.

## 2021-01-08 ENCOUNTER — Encounter (HOSPITAL_BASED_OUTPATIENT_CLINIC_OR_DEPARTMENT_OTHER): Payer: Self-pay | Admitting: Surgery

## 2021-01-08 LAB — SURGICAL PATHOLOGY

## 2021-07-07 IMAGING — DX DG CHEST 1V PORT
1 series · 1 of 1 positions shown · non-contrast
Comparison: 10/23/2017

CLINICAL DATA: Cough, wheezing, mild shortness of breath.

EXAM:
PORTABLE CHEST 1 VIEW

[chest ap]
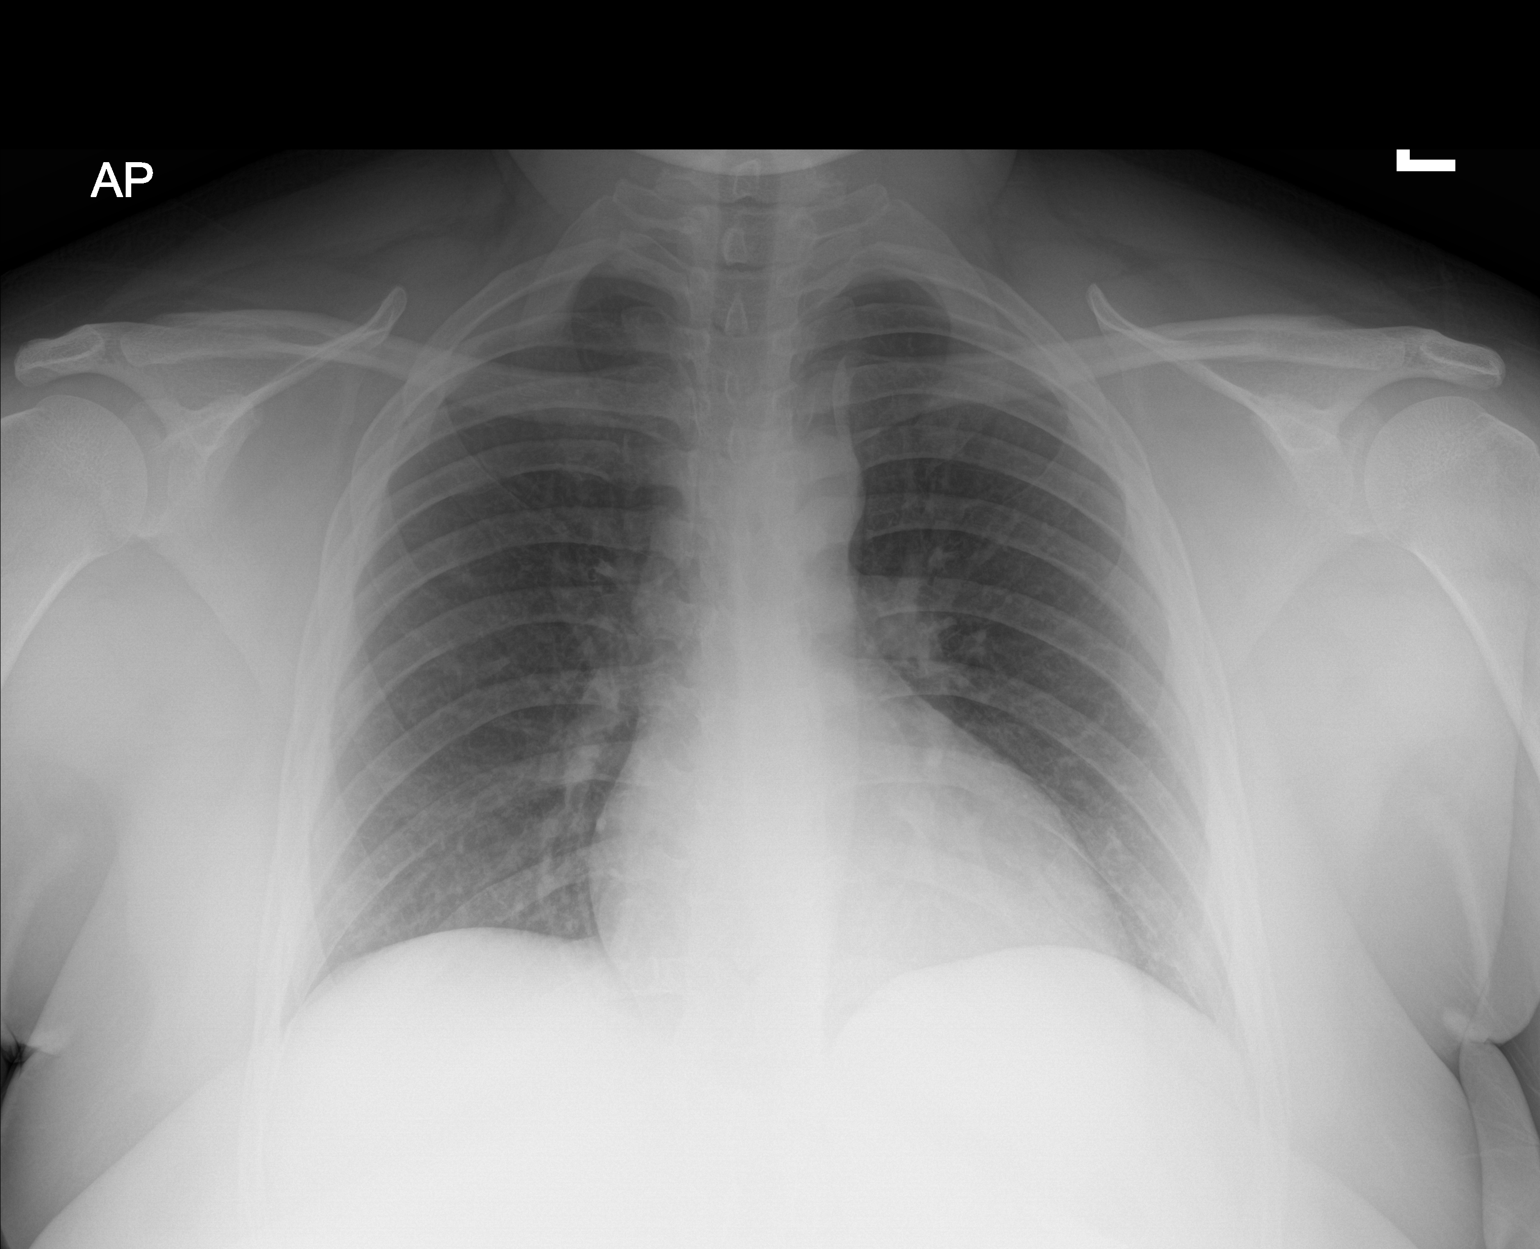

[1 of 1 positions shown; findings below may reference images not displayed]

FINDINGS: The cardiomediastinal contours are normal. The lungs are clear.
Pulmonary vasculature is normal. No consolidation, pleural effusion,
or pneumothorax. No acute osseous abnormalities are seen.
IMPRESSION: Unremarkable portable chest radiograph.

## 2022-05-29 ENCOUNTER — Other Ambulatory Visit: Payer: Self-pay

## 2022-05-29 ENCOUNTER — Encounter (HOSPITAL_BASED_OUTPATIENT_CLINIC_OR_DEPARTMENT_OTHER): Payer: Self-pay | Admitting: Emergency Medicine

## 2022-05-29 ENCOUNTER — Emergency Department (HOSPITAL_BASED_OUTPATIENT_CLINIC_OR_DEPARTMENT_OTHER)
Admission: EM | Admit: 2022-05-29 | Discharge: 2022-05-29 | Disposition: A | Discharge status: Home / Self Care | Payer: BC Managed Care – PPO | Attending: Emergency Medicine | Admitting: Emergency Medicine

## 2022-05-29 DIAGNOSIS — Z20822 Contact with and (suspected) exposure to covid-19: Secondary | ICD-10-CM | POA: Diagnosis not present

## 2022-05-29 DIAGNOSIS — R059 Cough, unspecified: Secondary | ICD-10-CM | POA: Diagnosis present

## 2022-05-29 DIAGNOSIS — J101 Influenza due to other identified influenza virus with other respiratory manifestations: Secondary | ICD-10-CM | POA: Diagnosis not present

## 2022-05-29 DIAGNOSIS — J45909 Unspecified asthma, uncomplicated: Secondary | ICD-10-CM | POA: Diagnosis not present

## 2022-05-29 LAB — RESP PANEL BY RT-PCR (FLU A&B, COVID) ARPGX2
Influenza A by PCR: NEGATIVE
Influenza B by PCR: POSITIVE — AB
SARS Coronavirus 2 by RT PCR: NEGATIVE

## 2022-05-29 MED ORDER — OSELTAMIVIR PHOSPHATE 75 MG PO CAPS: 75.0000 mg | ORAL_CAPSULE | Freq: Two times a day (BID) | ORAL | 0 refills | Status: AC

## 2022-05-29 NOTE — Discharge Instructions (Signed)
Begin taking Tamiflu as prescribed.  Drink plenty of fluids and get plenty of rest.  Take over-the-counter medications as needed for relief of symptoms.

## 2022-05-29 NOTE — ED Provider Notes (Signed)
[facility] EMERGENCY DEPARTMENT Provider Note   CSN: [id number] Arrival date & time: 05/29/22  0423     History  Chief Complaint  Patient presents with   Cough    Bridget Griffin is a 36 y.o. female.  Patient is a 36 year old female with history of GERD and asthma.  She presents with a 2-day history of congestion, cough, ear pressure, and feeling fevered.  She denies ill contacts.  She has had minimal relief with over-the-counter medications.  There are no aggravating factors.  The history is provided by the patient.       Home Medications Prior to Admission medications   Medication Sig Start Date End Date Taking? Authorizing Provider  albuterol (PROVENTIL HFA;VENTOLIN HFA) 108 (90 Base) MCG/ACT inhaler Inhale 2 puffs into the lungs every 4 (four) hours as needed for wheezing or shortness of breath. 08/03/18   Gilda Crease, MD  omeprazole (PRILOSEC OTC) 20 MG tablet Take 20 mg by mouth daily.    [provider]  ondansetron (ZOFRAN ODT) 4 MG disintegrating tablet Take 1 tablet (4 mg total) by mouth every 8 (eight) hours as needed for nausea or vomiting. 11/06/20   Sponseller, Lupe Carney R, PA-C  oxyCODONE (OXY IR/ROXICODONE) 5 MG immediate release tablet Take 1 tablet (5 mg total) by mouth every 6 (six) hours as needed for moderate pain, severe pain or breakthrough pain. 01/07/21   Abigail Miyamoto, MD  oxyCODONE-acetaminophen (PERCOCET/ROXICET) 5-325 MG tablet Take 1 tablet by mouth every 6 (six) hours as needed for severe pain. 11/06/20   Sponseller, Eugene Gavia, PA-C  spironolactone (ALDACTONE) 100 MG tablet Take 100 mg by mouth daily.    [provider]      Allergies    Naproxen    Review of Systems   Review of Systems  All other systems reviewed and are negative.   Physical Exam Updated Vital Signs BP (!) 138/94 (BP Location: Right Arm)   Pulse 81   Temp 99 F (37.2 C) (Oral)   Resp 18   Ht 5\' 4"  (1.626 m)   Wt 104.3 kg   LMP  05/04/2022   SpO2 99%   BMI 39.48 kg/m  Physical Exam Vitals and nursing note reviewed.  Constitutional:      General: She is not in acute distress.    Appearance: She is well-developed. She is not diaphoretic.  HENT:     Head: Normocephalic and atraumatic.     Right Ear: Tympanic membrane normal.     Left Ear: Tympanic membrane normal.     Mouth/Throat:     Mouth: Mucous membranes are moist.     Pharynx: No oropharyngeal exudate or posterior oropharyngeal erythema.  Cardiovascular:     Rate and Rhythm: Normal rate and regular rhythm.     Heart sounds: No murmur heard.    No friction rub. No gallop.  Pulmonary:     Effort: Pulmonary effort is normal. No respiratory distress.     Breath sounds: Normal breath sounds. No wheezing.  Abdominal:     General: Bowel sounds are normal. There is no distension.     Palpations: Abdomen is soft.     Tenderness: There is no abdominal tenderness.  Musculoskeletal:        General: Normal range of motion.     Cervical back: Normal range of motion and neck supple.  Skin:    General: Skin is warm and dry.  Neurological:     General: No focal deficit present.  Mental Status: She is alert and oriented to person, place, and time.     ED Results / Procedures / Treatments   Labs (all labs ordered are listed, but only abnormal results are displayed) Labs Reviewed  RESP PANEL BY RT-PCR (FLU A&B, COVID) [id number]    EKG None  Radiology No results found.  Procedures Procedures    Medications Ordered in ED Medications - No data to display  ED Course/ Medical Decision Making/ A&P  Patient presenting here with URI symptoms for the past 2 days.  She arrives here with stable vital signs and physical examination that is unremarkable.  COVID test negative, but she does test positive today for influenza B.  Patient requesting Tamiflu and will be discharged with over-the-counter medications and as needed return.  Work excuse given for the next  3 days.  Final Clinical Impression(s) / ED Diagnoses Final diagnoses:  None    Rx / DC Orders ED Discharge Orders     None         Geoffery Lyons, MD 05/29/22 [phone]

## 2022-05-29 NOTE — ED Triage Notes (Signed)
Cough, fever, sore throat, ear pain since Thursday. Tmax at home 102. No relief with OTC meds. No flu shot this season. Able to eat and drink, but painful to swallow.

## 2022-12-10 IMAGING — US US ABDOMEN LIMITED RUQ/ASCITES
1 series · 14 of 25 positions shown · non-contrast
Comparison: None.

CLINICAL DATA: Right upper quadrant pain

EXAM:
ULTRASOUND ABDOMEN LIMITED RIGHT UPPER QUADRANT

[Series 1: us abdomen limited ruq/ascites · 39 acquisitions, 14 frames shown]
[im 1/39]
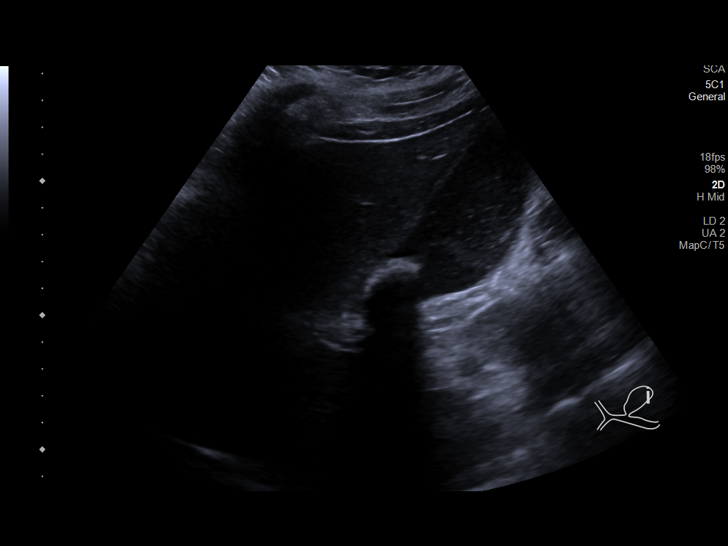
[im 4/39]
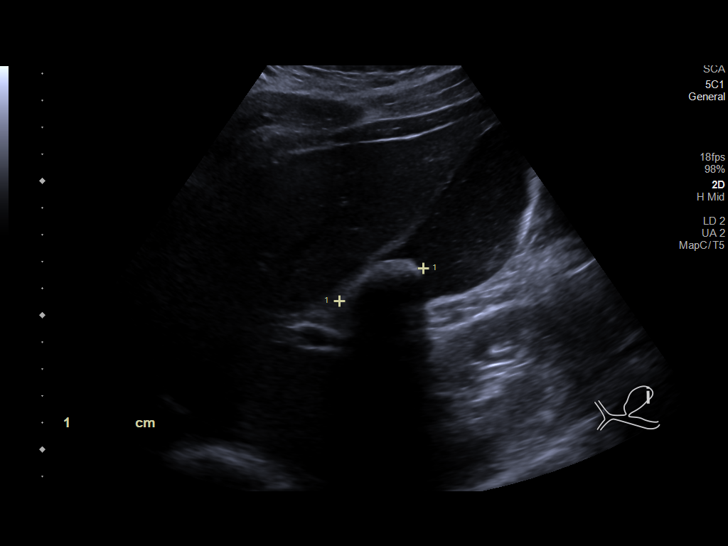
[im 7/39]
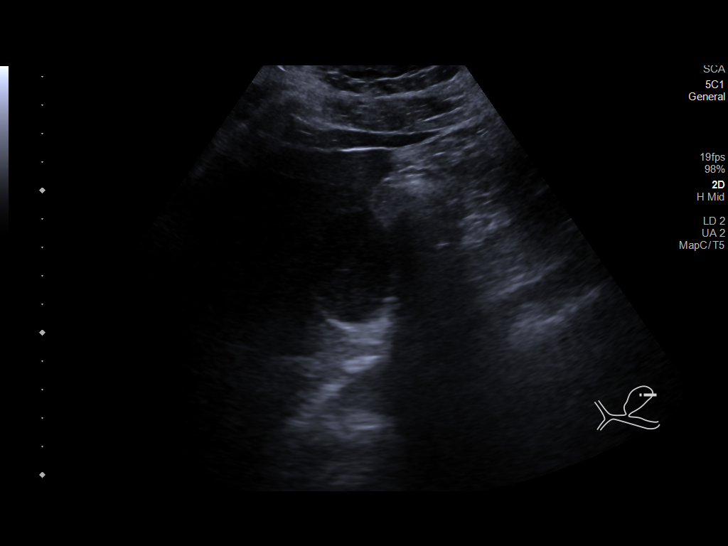
[im 10/39]
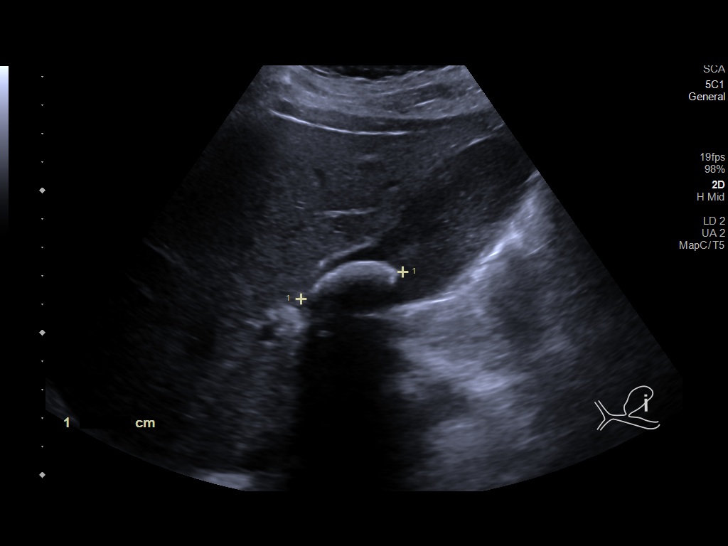
[im 13/39]
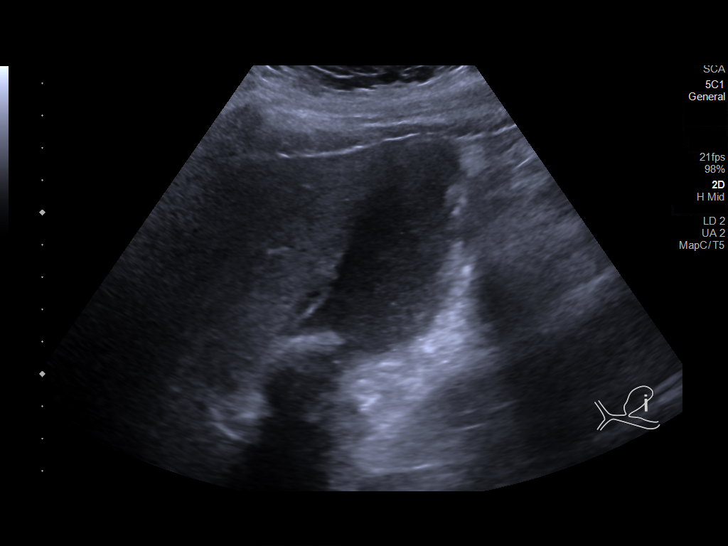
[im 15/39]
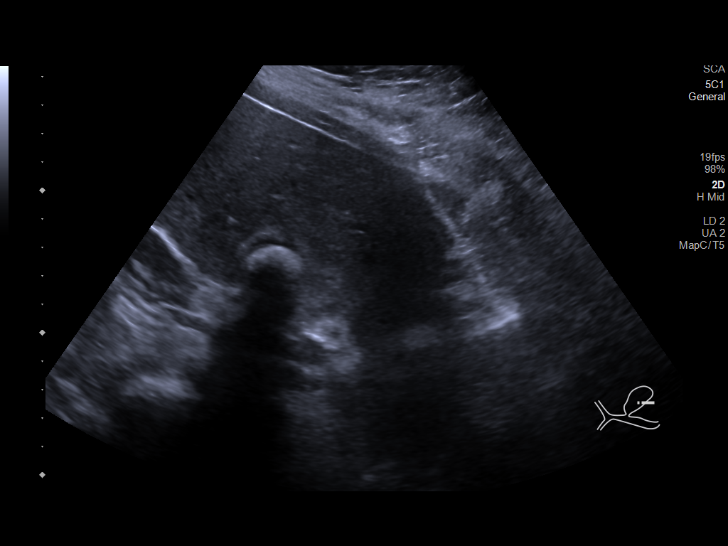
[im 18/39]
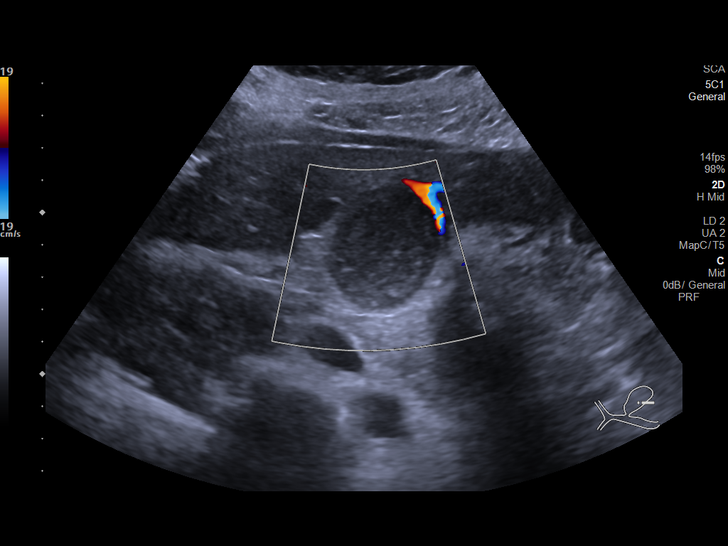
[im 21/39]
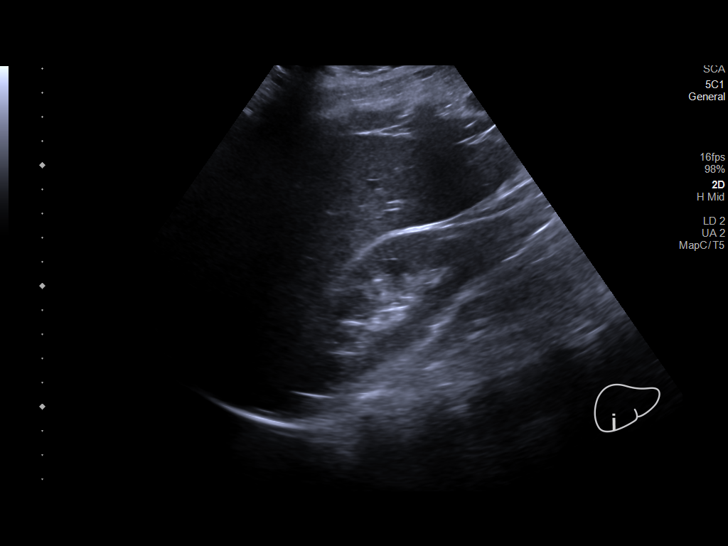
[im 24/39]
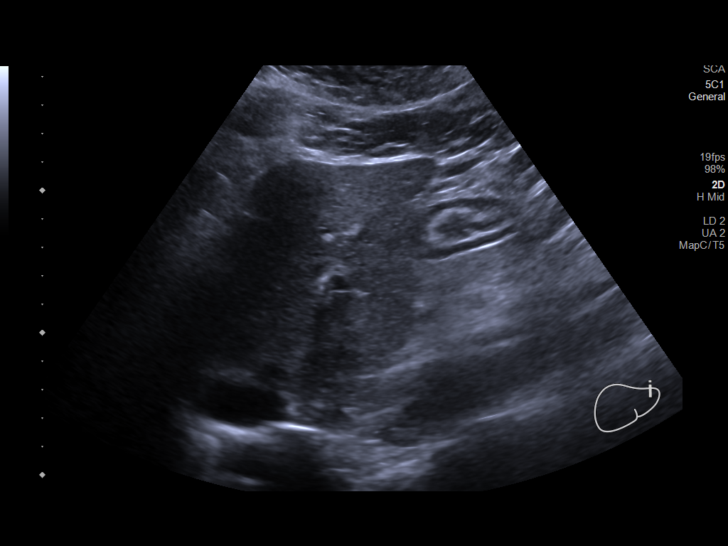
[im 26/39]
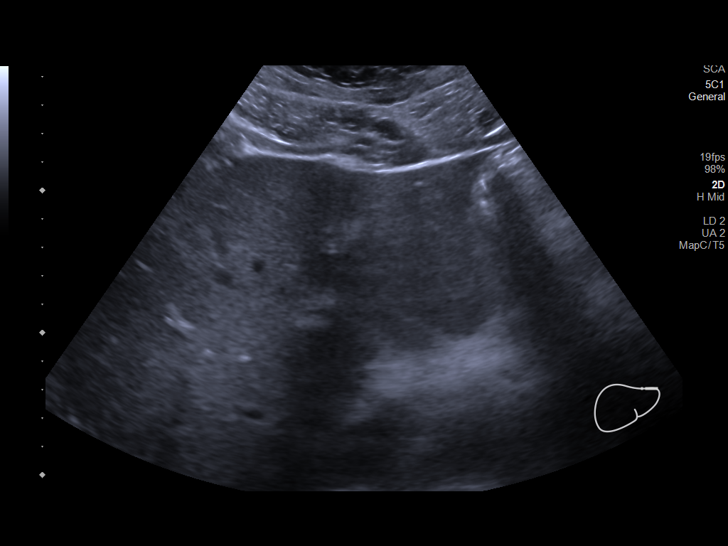
[im 29/39]
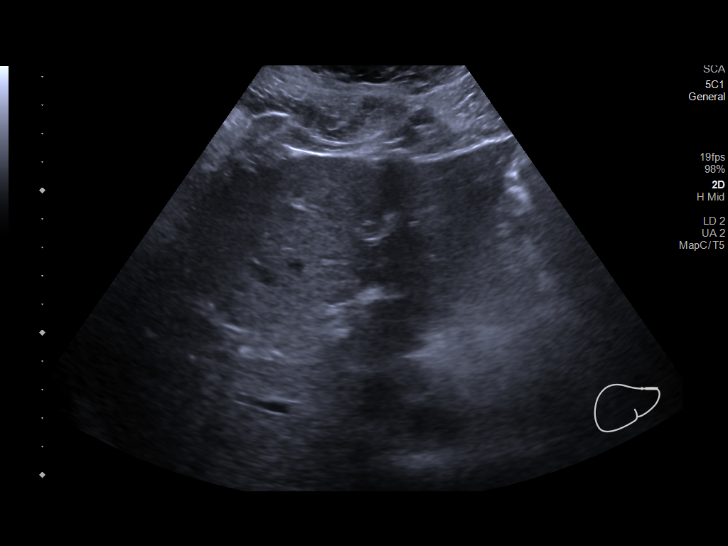
[im 32/39]
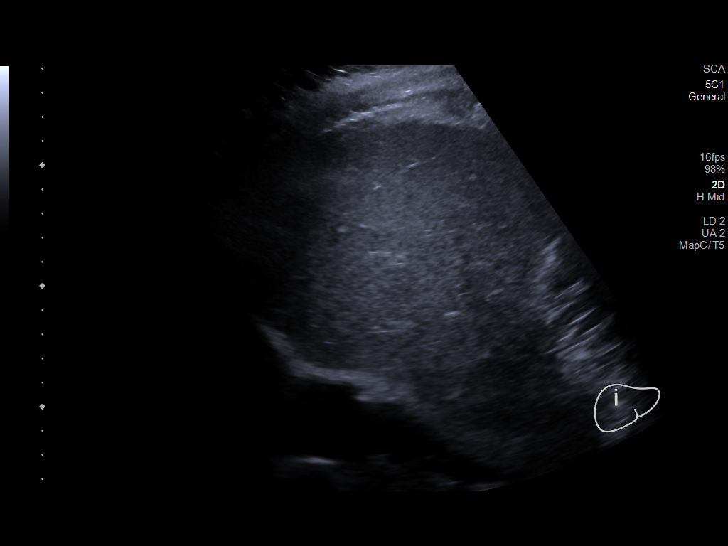
[im 35/39]
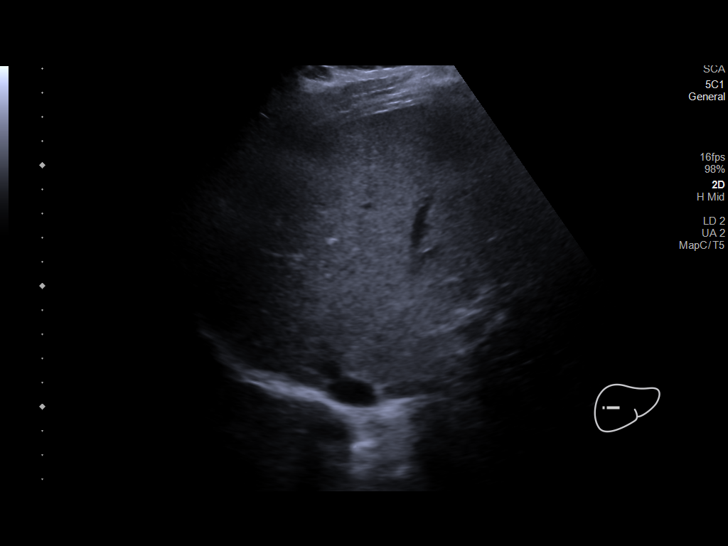
[im 39/39]
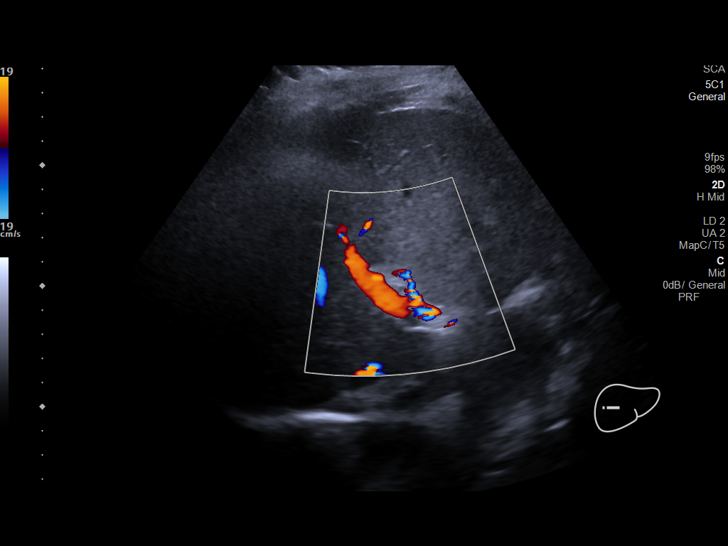

[14 of 25 positions shown; findings below may reference images not displayed]

FINDINGS: Gallbladder:

Within the gallbladder, there is an echogenic focus which moves and
shadows measuring 3.7 cm in length consistent with either a single
large or multiple adherent gallstones. There is sludge in the
gallbladder. There appears to be tumefactive sludge within the
gallbladder. No gallbladder wall thickening or pericholecystic
fluid. No sonographic Murphy sign noted by sonographer.

Common bile duct:

Diameter: 3 mm. No intrahepatic or extrahepatic biliary duct
dilatation.

Liver:

No focal lesion identified. Within normal limits in parenchymal
echogenicity. Portal vein is patent on color Doppler imaging with
normal direction of blood flow towards the liver.

Other: None.
IMPRESSION: Cholelithiasis an apparent tumefactive sludge within the
gallbladder. No gallbladder wall thickening or pericholecystic
fluid.

Study otherwise unremarkable.

## 2023-01-07 ENCOUNTER — Emergency Department (HOSPITAL_BASED_OUTPATIENT_CLINIC_OR_DEPARTMENT_OTHER): Payer: BC Managed Care – PPO

## 2023-01-07 ENCOUNTER — Emergency Department (HOSPITAL_BASED_OUTPATIENT_CLINIC_OR_DEPARTMENT_OTHER)
Admission: EM | Admit: 2023-01-07 | Discharge: 2023-01-07 | Disposition: A | Discharge status: Home / Self Care | Payer: BC Managed Care – PPO | Attending: Emergency Medicine | Admitting: Emergency Medicine

## 2023-01-07 ENCOUNTER — Encounter (HOSPITAL_BASED_OUTPATIENT_CLINIC_OR_DEPARTMENT_OTHER): Payer: Self-pay | Admitting: Emergency Medicine

## 2023-01-07 DIAGNOSIS — F17200 Nicotine dependence, unspecified, uncomplicated: Secondary | ICD-10-CM | POA: Diagnosis not present

## 2023-01-07 DIAGNOSIS — D649 Anemia, unspecified: Secondary | ICD-10-CM | POA: Insufficient documentation

## 2023-01-07 DIAGNOSIS — R0789 Other chest pain: Secondary | ICD-10-CM | POA: Diagnosis present

## 2023-01-07 LAB — CBC WITH DIFFERENTIAL/PLATELET
Abs Immature Granulocytes: 0.03 10*3/uL (ref 0.00–0.07)
Basophils Absolute: 0.1 10*3/uL (ref 0.0–0.1)
Basophils Relative: 1 %
Eosinophils Absolute: 0.1 10*3/uL (ref 0.0–0.5)
Eosinophils Relative: 2 %
HCT: 37.4 % (ref 36.0–46.0)
Hemoglobin: 11.8 g/dL — ABNORMAL LOW (ref 12.0–15.0)
Immature Granulocytes: 0 %
Lymphocytes Relative: 38 %
Lymphs Abs: 3.5 10*3/uL (ref 0.7–4.0)
MCH: 26 pg (ref 26.0–34.0)
MCHC: 31.6 g/dL (ref 30.0–36.0)
MCV: 82.6 fL (ref 80.0–100.0)
Monocytes Absolute: 0.7 10*3/uL (ref 0.1–1.0)
Monocytes Relative: 7 %
Neutro Abs: 4.7 10*3/uL (ref 1.7–7.7)
Neutrophils Relative %: 52 %
Platelets: 308 10*3/uL (ref 150–400)
RBC: 4.53 MIL/uL (ref 3.87–5.11)
RDW: 14.8 % (ref 11.5–15.5)
WBC: 9.1 10*3/uL (ref 4.0–10.5)
nRBC: 0 % (ref 0.0–0.2)

## 2023-01-07 LAB — TROPONIN I (HIGH SENSITIVITY): Troponin I (High Sensitivity): 2 ng/L (ref <18)

## 2023-01-07 LAB — BASIC METABOLIC PANEL
Anion gap: 8 (ref 5–15)
BUN: 13 mg/dL (ref 6–20)
CO2: 25 mmol/L (ref 22–32)
Calcium: 9.8 mg/dL (ref 8.9–10.3)
Chloride: 102 mmol/L (ref 98–111)
Creatinine, Ser: 1.08 mg/dL — ABNORMAL HIGH (ref 0.44–1.00)
GFR, Estimated: >60 mL/min (ref >60)
Glucose, Bld: 101 mg/dL — ABNORMAL HIGH (ref 70–99)
Potassium: 3.2 mmol/L — ABNORMAL LOW (ref 3.5–5.1)
Sodium: 135 mmol/L (ref 135–145)

## 2023-01-07 MED ORDER — IBUPROFEN 400 MG PO TABS
600.0000 mg | ORAL_TABLET | Freq: Once | ORAL | Status: AC
  Administered 2023-01-07: 600 mg via ORAL
  Filled 2023-01-07: qty 1

## 2023-01-07 NOTE — ED Provider Notes (Signed)
[place] [facility] AT [facility]  Provider Note  CSN: [id number] Arrival date & time: 01/07/23 0345  History Chief Complaint  Patient presents with   Chest Pain    Bridget Griffin is a 37 y.o. female with no significant PMH reports onset of dull aching L upper chest pain after she got home from work where she had been doing some lifting. She reports initially was mild, but got worse as she was trying to sleep tonight prompting her ED visit. She denies any SOB, some recent cough form bronchitis. No fever. No nausea or diaphoresis. She denies history of HTN, DM or HLD. She used to smoke but now vapes. She has a family history of CAD.    Home Medications Prior to Admission medications   Medication Sig Start Date End Date Taking? Authorizing Provider  albuterol (PROVENTIL HFA;VENTOLIN HFA) 108 (90 Base) MCG/ACT inhaler Inhale 2 puffs into the lungs every 4 (four) hours as needed for wheezing or shortness of breath. 08/03/18   Gilda Crease, MD  omeprazole (PRILOSEC OTC) 20 MG tablet Take 20 mg by mouth daily.    [provider]  ondansetron (ZOFRAN ODT) 4 MG disintegrating tablet Take 1 tablet (4 mg total) by mouth every 8 (eight) hours as needed for nausea or vomiting. 11/06/20   Sponseller, Eugene Gavia, PA-C  oseltamivir (TAMIFLU) 75 MG capsule Take 1 capsule (75 mg total) by mouth every 12 (twelve) hours. 05/29/22   Geoffery Lyons, MD  oxyCODONE (OXY IR/ROXICODONE) 5 MG immediate release tablet Take 1 tablet (5 mg total) by mouth every 6 (six) hours as needed for moderate pain, severe pain or breakthrough pain. 01/07/21   Abigail Miyamoto, MD  oxyCODONE-acetaminophen (PERCOCET/ROXICET) 5-325 MG tablet Take 1 tablet by mouth every 6 (six) hours as needed for severe pain. 11/06/20   Sponseller, Eugene Gavia, PA-C  spironolactone (ALDACTONE) 100 MG tablet Take 100 mg by mouth daily.    [provider]     Allergies    Naproxen   Review of Systems    Review of Systems Please see HPI for pertinent positives and negatives  Physical Exam BP 122/86 (BP Location: Right Arm)   Pulse 68   Temp 98.5 F (36.9 C) (Oral)   Resp 14   Ht 5\' 4"  (1.626 m)   Wt 102.1 kg   LMP 12/19/2022   SpO2 98%   BMI 38.62 kg/m   Physical Exam Vitals and nursing note reviewed.  Constitutional:      Appearance: Normal appearance.  HENT:     Head: Normocephalic and atraumatic.     Nose: Nose normal.     Mouth/Throat:     Mouth: Mucous membranes are moist.  Eyes:     Extraocular Movements: Extraocular movements intact.     Conjunctiva/sclera: Conjunctivae normal.  Cardiovascular:     Rate and Rhythm: Normal rate.  Pulmonary:     Effort: Pulmonary effort is normal.     Breath sounds: Normal breath sounds.  Chest:     Chest wall: Tenderness (L parasternal area) present.  Abdominal:     General: Abdomen is flat.     Palpations: Abdomen is soft.     Tenderness: There is no abdominal tenderness.  Musculoskeletal:        General: No swelling. Normal range of motion.     Cervical back: Neck supple.  Skin:    General: Skin is warm and dry.  Neurological:     General: No focal  deficit present.     Mental Status: She is alert.  Psychiatric:        Mood and Affect: Mood normal.     ED Results / Procedures / Treatments   EKG EKG Interpretation Date/Time:  Saturday January 07 2023 03:57:18 EDT Ventricular Rate:  73 PR Interval:  144 QRS Duration:  88 QT Interval:  401 QTC Calculation: 442 R Axis:   37  Text Interpretation: Sinus rhythm Normal ECG No significant change since last tracing Confirmed by Susy Frizzle [phone]) on 01/07/2023 4:08:11 AM  Procedures Procedures  Medications Ordered in the ED Medications  ibuprofen (ADVIL) tablet 600 mg (600 mg Oral Given 01/07/23 0419)    Initial Impression and Plan  Patient here with atypical, reproducible chest pain. Low risk for CAD/ACS. PERC neg. Will check labs, CXR. Motrin for likely MSK  pain.   ED Course   Clinical Course as of 01/07/23 0505  Sat Jan 07, 2023  6045 I personally viewed the images from radiology studies and agree with radiologist interpretation:  CXR is clear [CS]  0436 CBC with mild anemia.  [CS]  0455 BMP unremarkable. Lab tech reports technical difficulties getting Calcium result, sample sent to [facility] for recheck [CS]  0502 Trop is normal. Given duration of symptoms and atypical nature of pain, delta trop is not needed to rule out AMI. Patient is resting comfortably in no distress. Suspect MSK pain. Recommend rest NSAIDs and PCP follow up, RTED for any other concerns.   [CS]    Clinical Course User Index [CS] Pollyann Savoy, MD     MDM Rules/Calculators/A&P Medical Decision Making Given presenting complaint, I considered that admission might be necessary. After review of results from ED lab and/or imaging studies, admission to the hospital is not indicated at this time.    Problems Addressed: Atypical chest pain: acute illness or injury  Amount and/or Complexity of Data Reviewed Labs: ordered. Decision-making details documented in ED Course. Radiology: ordered and independent interpretation performed. Decision-making details documented in ED Course.  Risk OTC drugs. Decision regarding hospitalization.     Final Clinical Impression(s) / ED Diagnoses Final diagnoses:  Atypical chest pain    Rx / DC Orders ED Discharge Orders     None        Pollyann Savoy, MD 01/07/23 [phone]

## 2023-01-07 NOTE — ED Triage Notes (Signed)
Pt states chest pain started about 630-7pm yesterday, started off dull, pt went to sleep when she woke up about 230 pain worse, is worse with movement.

## 2023-10-14 ENCOUNTER — Other Ambulatory Visit: Payer: Self-pay

## 2023-10-14 ENCOUNTER — Emergency Department (HOSPITAL_BASED_OUTPATIENT_CLINIC_OR_DEPARTMENT_OTHER)
Admission: EM | Admit: 2023-10-14 | Discharge: 2023-10-14 | Disposition: A | Discharge status: Home / Self Care | Attending: Emergency Medicine | Admitting: Emergency Medicine

## 2023-10-14 ENCOUNTER — Encounter (HOSPITAL_BASED_OUTPATIENT_CLINIC_OR_DEPARTMENT_OTHER): Payer: Self-pay | Admitting: Emergency Medicine

## 2023-10-14 DIAGNOSIS — W57XXXA Bitten or stung by nonvenomous insect and other nonvenomous arthropods, initial encounter: Secondary | ICD-10-CM

## 2023-10-14 DIAGNOSIS — L03114 Cellulitis of left upper limb: Secondary | ICD-10-CM

## 2023-10-14 DIAGNOSIS — S50862A Insect bite (nonvenomous) of left forearm, initial encounter: Secondary | ICD-10-CM | POA: Diagnosis present

## 2023-10-14 MED ORDER — CEPHALEXIN 500 MG PO CAPS: 1000.0000 mg | ORAL_CAPSULE | Freq: Two times a day (BID) | ORAL | 0 refills | Status: AC

## 2023-10-14 MED ORDER — FLUCONAZOLE 200 MG PO TABS: 200.0000 mg | ORAL_TABLET | Freq: Once | ORAL | 0 refills | Status: AC

## 2023-10-14 NOTE — Discharge Instructions (Addendum)
 Please take the entire course of antibiotics that I prescribed, and return to the emergency department if your swelling, redness is worsening despite taking this medicine.  You can try Tylenol, ibuprofen as needed for pain, swelling at home.

## 2023-10-14 NOTE — ED Provider Notes (Signed)
 [place] [facility] AT [facility] Provider Note   CSN: [id number] Arrival date & time: 10/14/23  1429     History  Chief Complaint  Patient presents with   Insect Bite    Bridget Griffin is a 38 y.o. female with noncontributory past medical history presents concern for swelling to left forearm after possible insect bite last night.  Patient reports that she did not wash something bite her but she did notice a more focal red bite before noticing some more significant swelling this morning.  She reports no allergies to antibiotics.  She denies any systemic fever, chills.  HPI     Home Medications Prior to Admission medications   Medication Sig Start Date End Date Taking? Authorizing Provider  cephALEXin (KEFLEX) 500 MG capsule Take 2 capsules (1,000 mg total) by mouth 2 (two) times daily. 10/14/23  Yes Avante Carneiro H, PA-C  albuterol (PROVENTIL HFA;VENTOLIN HFA) 108 (90 Base) MCG/ACT inhaler Inhale 2 puffs into the lungs every 4 (four) hours as needed for wheezing or shortness of breath. 08/03/18   Ballard Bongo, MD  omeprazole (PRILOSEC OTC) 20 MG tablet Take 20 mg by mouth daily.    [provider]  ondansetron (ZOFRAN ODT) 4 MG disintegrating tablet Take 1 tablet (4 mg total) by mouth every 8 (eight) hours as needed for nausea or vomiting. 11/06/20   Sponseller, Adelle Agent, PA-C  oseltamivir (TAMIFLU) 75 MG capsule Take 1 capsule (75 mg total) by mouth every 12 (twelve) hours. 05/29/22   Orvilla Blander, MD  oxyCODONE (OXY IR/ROXICODONE) 5 MG immediate release tablet Take 1 tablet (5 mg total) by mouth every 6 (six) hours as needed for moderate pain, severe pain or breakthrough pain. 01/07/21   Oza Blumenthal, MD  oxyCODONE-acetaminophen (PERCOCET/ROXICET) 5-325 MG tablet Take 1 tablet by mouth every 6 (six) hours as needed for severe pain. 11/06/20   Sponseller, Adelle Agent, PA-C  spironolactone (ALDACTONE) 100 MG tablet Take 100 mg by mouth  daily.    [provider]      Allergies    Naproxen    Review of Systems   Review of Systems  All other systems reviewed and are negative.   Physical Exam Updated Vital Signs BP (!) 157/109   Pulse 63   Temp 98.2 F (36.8 C)   Resp 18   Ht 5\' 4"  (1.626 m)   Wt 102.1 kg   SpO2 98%   BMI 38.64 kg/m  Physical Exam Vitals and nursing note reviewed.  Constitutional:      General: She is not in acute distress.    Appearance: Normal appearance.  HENT:     Head: Normocephalic and atraumatic.  Eyes:     General:        Right eye: No discharge.        Left eye: No discharge.  Cardiovascular:     Rate and Rhythm: Normal rate and regular rhythm.  Pulmonary:     Effort: Pulmonary effort is normal. No respiratory distress.  Musculoskeletal:        General: No deformity.     Comments: Normal range of motion of left wrist, low clinical suspicion for developing septic arthritis.  Redness does not streak away from the site of the developing cellulitis.  Skin:    General: Skin is warm and dry.     Comments: Patient with approximately 4x4cm of erythema, soft tissue swelling.  Neurological:     Mental Status: She is alert and  oriented to person, place, and time.  Psychiatric:        Mood and Affect: Mood normal.        Behavior: Behavior normal.     ED Results / Procedures / Treatments   Labs (all labs ordered are listed, but only abnormal results are displayed) Labs Reviewed - No data to display  EKG None  Radiology No results found.  Procedures Procedures    Medications Ordered in ED Medications - No data to display  ED Course/ Medical Decision Making/ A&P                                 Medical Decision Making  This is a well-appearing 38 year old female without significant medical history who presents concern for Possible insect bite with some new swelling of the left forearm.  Considered abscess, cellulitis, developing septic arthritis, flexor  tenosynovitis, or other infectious ideology secondary to insect bite.  Physical exam notable for Patient with approximately 4x4cm of erythema, soft tissue swelling. Normal range of motion of left wrist, low clinical suspicion for developing septic arthritis.  Redness does not streak away from the site of the developing cellulitis. Final Clinical Impression(s) / ED Diagnoses Final diagnoses:  Insect bite of left forearm, initial encounter  Cellulitis of left upper extremity    Rx / DC Orders ED Discharge Orders          Ordered    cephALEXin (KEFLEX) 500 MG capsule  2 times daily        10/14/23 1448              Ziyon Cedotal, [place]et H, PA-C 10/14/23 1453    Lowery Rue, DO 10/15/23 [phone]

## 2023-10-14 NOTE — ED Triage Notes (Signed)
 Pt with swelling and itching to LFA; thinks she was bitten by something last night
